# Patient Record
Sex: Female | Born: 1955 | Race: White | Hispanic: No | Marital: Married | Smoking: Former smoker
Health system: Southern US, Community
[De-identification: ages and names within clinical notes are randomized; demographics above are authoritative.]

## PROBLEM LIST (undated history)

## (undated) DIAGNOSIS — G4733 Obstructive sleep apnea (adult) (pediatric): Secondary | ICD-10-CM

## (undated) DIAGNOSIS — I10 Essential (primary) hypertension: Secondary | ICD-10-CM

## (undated) DIAGNOSIS — B029 Zoster without complications: Secondary | ICD-10-CM

## (undated) DIAGNOSIS — B019 Varicella without complication: Secondary | ICD-10-CM

## (undated) DIAGNOSIS — E785 Hyperlipidemia, unspecified: Secondary | ICD-10-CM

## (undated) DIAGNOSIS — Z01419 Encounter for gynecological examination (general) (routine) without abnormal findings: Secondary | ICD-10-CM

## (undated) DIAGNOSIS — G4730 Sleep apnea, unspecified: Secondary | ICD-10-CM

## (undated) HISTORY — DX: Encounter for gynecological examination (general) (routine) without abnormal findings: Z01.419

## (undated) HISTORY — DX: Essential (primary) hypertension: I10

## (undated) HISTORY — PX: MYOMECTOMY: SHX85

## (undated) HISTORY — DX: Obstructive sleep apnea (adult) (pediatric): G47.33

## (undated) HISTORY — DX: Zoster without complications: B02.9

## (undated) HISTORY — DX: Sleep apnea, unspecified: G47.30

## (undated) HISTORY — DX: Hyperlipidemia, unspecified: E78.5

## (undated) HISTORY — DX: Varicella without complication: B01.9

---

## 1990-11-12 HISTORY — PX: ORIF TIBIA & FIBULA FRACTURES: SHX2131

## 1994-11-12 HISTORY — PX: LEFT OOPHORECTOMY: SHX1961

## 1998-09-01 ENCOUNTER — Inpatient Hospital Stay (HOSPITAL_COMMUNITY): Admission: AD | Admit: 1998-09-01 | Discharge: 1998-09-03 | Payer: Self-pay | Admitting: *Deleted

## 1999-12-08 ENCOUNTER — Encounter: Payer: Self-pay | Admitting: Internal Medicine

## 1999-12-08 ENCOUNTER — Encounter: Admission: RE | Admit: 1999-12-08 | Discharge: 1999-12-08 | Payer: Self-pay | Admitting: Internal Medicine

## 2001-08-28 ENCOUNTER — Other Ambulatory Visit: Admission: RE | Admit: 2001-08-28 | Discharge: 2001-08-28 | Payer: Self-pay | Admitting: *Deleted

## 2001-09-02 ENCOUNTER — Encounter: Admission: RE | Admit: 2001-09-02 | Discharge: 2001-09-02 | Payer: Self-pay | Admitting: *Deleted

## 2001-09-02 ENCOUNTER — Encounter: Payer: Self-pay | Admitting: *Deleted

## 2002-02-20 ENCOUNTER — Other Ambulatory Visit: Admission: RE | Admit: 2002-02-20 | Discharge: 2002-02-20 | Payer: Self-pay | Admitting: *Deleted

## 2002-09-03 ENCOUNTER — Other Ambulatory Visit: Admission: RE | Admit: 2002-09-03 | Discharge: 2002-09-03 | Payer: Self-pay | Admitting: Obstetrics and Gynecology

## 2002-11-06 ENCOUNTER — Encounter: Payer: Self-pay | Admitting: Obstetrics and Gynecology

## 2002-11-06 ENCOUNTER — Encounter: Admission: RE | Admit: 2002-11-06 | Discharge: 2002-11-06 | Payer: Self-pay | Admitting: Obstetrics and Gynecology

## 2004-09-26 ENCOUNTER — Encounter: Admission: RE | Admit: 2004-09-26 | Discharge: 2004-09-26 | Payer: Self-pay | Admitting: Obstetrics and Gynecology

## 2006-09-16 ENCOUNTER — Encounter: Admission: RE | Admit: 2006-09-16 | Discharge: 2006-09-16 | Payer: Self-pay | Admitting: Obstetrics and Gynecology

## 2007-09-26 ENCOUNTER — Encounter (INDEPENDENT_AMBULATORY_CARE_PROVIDER_SITE_OTHER): Payer: Self-pay | Admitting: Obstetrics and Gynecology

## 2007-09-26 ENCOUNTER — Ambulatory Visit (HOSPITAL_COMMUNITY): Admission: RE | Admit: 2007-09-26 | Discharge: 2007-09-26 | Payer: Self-pay | Admitting: Obstetrics and Gynecology

## 2007-11-13 HISTORY — PX: UTERINE FIBROID SURGERY: SHX826

## 2008-07-05 ENCOUNTER — Ambulatory Visit: Payer: Self-pay | Admitting: Family Medicine

## 2008-08-09 ENCOUNTER — Ambulatory Visit: Payer: Self-pay | Admitting: Family Medicine

## 2008-08-09 LAB — CONVERTED CEMR LAB
Bilirubin Urine: NEGATIVE
Blood in Urine, dipstick: NEGATIVE
Glucose, Urine, Semiquant: NEGATIVE
Ketones, urine, test strip: NEGATIVE
Nitrite: NEGATIVE
Protein, U semiquant: NEGATIVE
Specific Gravity, Urine: 1.02
Urobilinogen, UA: 0.2
WBC Urine, dipstick: NEGATIVE
pH: 5.5

## 2008-08-10 LAB — CONVERTED CEMR LAB
ALT: 21 units/L (ref 0–35)
AST: 22 units/L (ref 0–37)
Albumin: 4.1 g/dL (ref 3.5–5.2)
Alkaline Phosphatase: 84 units/L (ref 39–117)
BUN: 15 mg/dL (ref 6–23)
Basophils Absolute: 0.1 10*3/uL (ref 0.0–0.1)
Basophils Relative: 1.2 % (ref 0.0–3.0)
Bilirubin, Direct: 0.1 mg/dL (ref 0.0–0.3)
CO2: 29 meq/L (ref 19–32)
Calcium: 9.8 mg/dL (ref 8.4–10.5)
Chloride: 108 meq/L (ref 96–112)
Cholesterol: 194 mg/dL (ref 0–200)
Creatinine, Ser: 0.7 mg/dL (ref 0.4–1.2)
Eosinophils Absolute: 0.4 10*3/uL (ref 0.0–0.7)
Eosinophils Relative: 4.9 % (ref 0.0–5.0)
GFR calc Af Amer: 113 mL/min
GFR calc non Af Amer: 94 mL/min
Glucose, Bld: 87 mg/dL (ref 70–99)
HCT: 41 % (ref 36.0–46.0)
HDL: 40.1 mg/dL (ref >39.0)
Hemoglobin: 13.9 g/dL (ref 12.0–15.0)
LDL Cholesterol: 123 mg/dL — ABNORMAL HIGH (ref 0–99)
Lymphocytes Relative: 30.2 % (ref 12.0–46.0)
MCHC: 34 g/dL (ref 30.0–36.0)
MCV: 86.3 fL (ref 78.0–100.0)
Monocytes Absolute: 0.5 10*3/uL (ref 0.1–1.0)
Monocytes Relative: 6.6 % (ref 3.0–12.0)
Neutro Abs: 4.4 10*3/uL (ref 1.4–7.7)
Neutrophils Relative %: 57.1 % (ref 43.0–77.0)
Platelets: 263 10*3/uL (ref 150–400)
Potassium: 4.1 meq/L (ref 3.5–5.1)
RBC: 4.74 M/uL (ref 3.87–5.11)
RDW: 12.4 % (ref 11.5–14.6)
Sodium: 141 meq/L (ref 135–145)
TSH: 1.92 microintl units/mL (ref 0.35–5.50)
Total Bilirubin: 0.9 mg/dL (ref 0.3–1.2)
Total CHOL/HDL Ratio: 4.8
Total Protein: 7.5 g/dL (ref 6.0–8.3)
Triglycerides: 154 mg/dL — ABNORMAL HIGH (ref 0–149)
VLDL: 31 mg/dL (ref 0–40)
WBC: 7.8 10*3/uL (ref 4.5–10.5)

## 2010-12-08 ENCOUNTER — Encounter: Payer: Self-pay | Admitting: Family Medicine

## 2010-12-20 NOTE — Letter (Signed)
Summary: [facility]  [facility]   Imported By: Maryln Gottron 12/15/2010 12:11:20  _____________________________________________________________________  External Attachment:    Type:   Image     Comment:   External Document

## 2011-03-27 NOTE — H&P (Signed)
NAMEMERYN, Aimee Swanson             ACCOUNT NO.:  [id number]   MEDICAL RECORD NO.:  [id number]          PATIENT TYPE:  AMB   LOCATION:  [facility]                           FACILITY:  [facility]   PHYSICIAN:  Lenoard Aden, M.D.DATE OF BIRTH:  30-Dec-1955   DATE OF ADMISSION:  09/26/2007  DATE OF DISCHARGE:                              HISTORY & PHYSICAL   CHIEF COMPLAINT:  Post menopausal bleeding.   She is a 55 year old white female, G1, P1 with a history of a C-section  x1, who presents with post menopausal bleeding and questionable  structural abnormality on saline sonohysterography.  She presents for a  hysteroscopy and resection.   She has allergies to:  1. PENICILLIN.  2. IBUPROFEN.  3. ASPIRIN.   FAMILY HISTORY:  Hypertension, melanoma.   PERSONAL MEDICAL HISTORY:  1. Cesarean section.  2. Laparoscopy  3. Left salpingo-oophorectomy.  4. Fractured leg with 2 screws placed.  5. Endometrial biopsy.  6. Uterine fibroid.  7. Myomectomies in 1993 and 1995.   She is a nonsmoker, nondrinker.  She denies domestic or physical  violence.   MEDICATIONS:  Multivitamin.   PHYSICAL EXAMINATION:  VITAL SIGNS:  Blood pressure is 125/70, weight  200 pounds.  HEENT:  Normal.  LUNGS:  Clear.  HEART:  Regular rhythm.  ABDOMEN:  Soft and nontender.  PELVIC:  Reveals an enlarged uterus with probable submucous fibroid as  noted by sonohysterography.  EXTREMITIES:  Reveal no cords.  NEUROLOGIC:  Nonfocal.  SKIN:  Intact.   IMPRESSION:  Post menopausal bleeding with questionable structural  lesion.   PLAN:  Proceed with diagnostic hysteroscopy D&C.  Risks of anesthesia,  infection, bleeding, injury to abdominal organs, need for repair is  discussed. Delayed versus immediate complications to include bowel and  bladder injury noted.  The patient acknowledges and wishes to proceed.      Lenoard Aden, M.D.  Electronically Signed     RJT/MEDQ  D:  09/25/2007  T:  09/26/2007  Job:   [id number]

## 2011-03-27 NOTE — Op Note (Signed)
Aimee Swanson, Aimee Swanson             ACCOUNT NO.:  [id number]   MEDICAL RECORD NO.:  [id number]          PATIENT TYPE:  AMB   LOCATION:  [facility]                           FACILITY:  [facility]   PHYSICIAN:  Lenoard Aden, M.D.DATE OF BIRTH:  [birth year]   DATE OF PROCEDURE:  09/26/2007  DATE OF DISCHARGE:                               OPERATIVE REPORT   PREOPERATIVE DIAGNOSIS:  Postmenopausal bleeding with probable  submucosal fibroid.   POSTOPERATIVE DIAGNOSIS:  Submucosal fibroid.   PROCEDURE:  Diagnostic hysteroscopy, resection of large submucous  fibroid.   SURGEON:  Olivia Mackie, M.D.   ANESTHESIA:  General.   BLOOD LOSS:  Less than 50 mL.   COMPLICATIONS:  None.   DRAINS:  None.   COUNTS:  Correct.   RECOVERY:  In good condition.   FLUID DEFICIT:  125 mL.   BRIEF OPERATIVE NOTE:  After discussing the risks of anesthesia,  infection, bleeding, injury to abdominal organs, need for repair,  possibility of uterine perforation, need for repair the patient was  taken to the operating room where she was administered a general  anesthetic without complications.  She was prepped and draped in a  sterile fashion.  Catheterized till the bladder was empty.  Exam under  anesthesia reveals a bulky mid position uterus.  Dilute Nesacaine  solution was placed for a paracervical block 20 cc total.  Dilute  Pitressin solution of 20 cc at the cervicovaginal junction at 3 and 9  o'clock was placed as well in the standard fashion.  After dilating the  cervix it was easily up to #29 [facility] dilator.  Hysteroscope was placed.  Visualization reveals a large posterior fundal appearing fibroid which  was resected in multiple passes down to the base using a double angle  right-angle loop.  After multiple  passes good hemostasis was noted.  Point coagulation was performed with  the loop.  The uterus was intact.  No evidence of perforation.  Fluid  deficit 125 mL.  D&C performed with sharp curettage.   The patient  tolerated the procedure well and is transferred to recovery in good  condition.      Lenoard Aden, M.D.  Electronically Signed     RJT/MEDQ  D:  09/26/2007  T:  09/27/2007  Job:  [id number]

## 2011-04-30 ENCOUNTER — Other Ambulatory Visit: Payer: Self-pay | Admitting: Obstetrics and Gynecology

## 2011-04-30 DIAGNOSIS — Z1231 Encounter for screening mammogram for malignant neoplasm of breast: Secondary | ICD-10-CM

## 2011-05-10 ENCOUNTER — Ambulatory Visit
Admission: RE | Admit: 2011-05-10 | Discharge: 2011-05-10 | Disposition: A | Discharge status: Home / Self Care | Payer: BC Managed Care – PPO | Source: Ambulatory Visit | Attending: Obstetrics and Gynecology | Admitting: Obstetrics and Gynecology

## 2011-05-10 DIAGNOSIS — Z1231 Encounter for screening mammogram for malignant neoplasm of breast: Secondary | ICD-10-CM

## 2011-06-01 ENCOUNTER — Other Ambulatory Visit (INDEPENDENT_AMBULATORY_CARE_PROVIDER_SITE_OTHER): Payer: BC Managed Care – PPO

## 2011-06-01 DIAGNOSIS — Z0000 Encounter for general adult medical examination without abnormal findings: Secondary | ICD-10-CM

## 2011-06-01 LAB — HEPATIC FUNCTION PANEL
Albumin: 4.4 g/dL (ref 3.5–5.2)
Alkaline Phosphatase: 96 U/L (ref 39–117)

## 2011-06-01 LAB — BASIC METABOLIC PANEL
Chloride: 106 mEq/L (ref 96–112)
Creatinine, Ser: 0.7 mg/dL (ref 0.4–1.2)
Potassium: 4 mEq/L (ref 3.5–5.1)
Sodium: 140 mEq/L (ref 135–145)

## 2011-06-01 LAB — CBC WITH DIFFERENTIAL/PLATELET
Basophils Relative: 1.8 % (ref 0.0–3.0)
Eosinophils Relative: 10.1 % — ABNORMAL HIGH (ref 0.0–5.0)
Hemoglobin: 13.8 g/dL (ref 12.0–15.0)
MCV: 84.7 fl (ref 78.0–100.0)
Monocytes Absolute: 0.5 10*3/uL (ref 0.1–1.0)
Neutro Abs: 3.3 10*3/uL (ref 1.4–7.7)
Neutrophils Relative %: 44 % (ref 43.0–77.0)
RBC: 4.81 Mil/uL (ref 3.87–5.11)
WBC: 7.4 10*3/uL (ref 4.5–10.5)

## 2011-06-01 LAB — LIPID PANEL
HDL: 40 mg/dL (ref >39.00)
LDL Cholesterol: 127 mg/dL — ABNORMAL HIGH (ref 0–99)
Total CHOL/HDL Ratio: 5
Triglycerides: 159 mg/dL — ABNORMAL HIGH (ref 0.0–149.0)

## 2011-06-01 LAB — POCT URINALYSIS DIPSTICK
Glucose, UA: NEGATIVE
Nitrite, UA: NEGATIVE
Urobilinogen, UA: 0.2

## 2011-06-01 LAB — TSH: TSH: 1.98 u[IU]/mL (ref 0.35–5.50)

## 2011-06-07 ENCOUNTER — Encounter: Payer: Self-pay | Admitting: Family Medicine

## 2011-06-07 ENCOUNTER — Telehealth: Payer: Self-pay | Admitting: Family Medicine

## 2011-06-07 NOTE — Telephone Encounter (Signed)
Message copied by Baldemar Friday on Thu Jun 07, 2011  2:20 PM ------      Message from: Gershon Crane A      Created: Tue Jun 05, 2011  8:41 AM       Normal except mildly high chol. Watch the diet

## 2011-06-07 NOTE — Telephone Encounter (Signed)
Left message with result.

## 2011-06-12 ENCOUNTER — Encounter: Payer: Self-pay | Admitting: Family Medicine

## 2011-06-12 ENCOUNTER — Ambulatory Visit (INDEPENDENT_AMBULATORY_CARE_PROVIDER_SITE_OTHER): Payer: BC Managed Care – PPO | Admitting: Family Medicine

## 2011-06-12 VITALS — BP 110/80 | HR 77 | Temp 98.1°F | Ht 65.5 in | Wt 206.0 lb

## 2011-06-12 DIAGNOSIS — Z0000 Encounter for general adult medical examination without abnormal findings: Secondary | ICD-10-CM

## 2011-06-12 DIAGNOSIS — G4730 Sleep apnea, unspecified: Secondary | ICD-10-CM

## 2011-06-12 NOTE — Progress Notes (Signed)
  Subjective:    Patient ID: Aimee Swanson, female    DOB: 1956/01/23, 55 y.o.   MRN: [id number]  HPI 55 yr old female for a cpx. She feels well but she is worried about her snoring. She has snored for years, and her husband notes that she seems to have trouble  Breathing at night. She knows she is overweight, and she is trying to do something about this. She is walking 3 miles a day and has eliminated fast food.    Review of Systems  Constitutional: Negative.  Negative for fever, diaphoresis, activity change, appetite change, fatigue and unexpected weight change.  HENT: Negative.  Negative for hearing loss, ear pain, nosebleeds, congestion, sore throat, trouble swallowing, neck pain, neck stiffness, voice change and tinnitus.   Eyes: Negative.  Negative for photophobia, pain, discharge, redness and visual disturbance.  Respiratory: Negative.  Negative for apnea, cough, choking, chest tightness, shortness of breath, wheezing and stridor.   Cardiovascular: Negative.  Negative for chest pain, palpitations and leg swelling.  Gastrointestinal: Negative.  Negative for nausea, vomiting, abdominal pain, diarrhea, constipation, blood in stool, abdominal distention and rectal pain.  Genitourinary: Negative.  Negative for dysuria, urgency, frequency, hematuria, flank pain, decreased urine volume, vaginal bleeding, vaginal discharge, enuresis, difficulty urinating, vaginal pain, menstrual problem, pelvic pain and dyspareunia.  Musculoskeletal: Negative.  Negative for myalgias, back pain, joint swelling, arthralgias and gait problem.  Skin: Negative.  Negative for color change, pallor, rash and wound.  Neurological: Negative.  Negative for dizziness, tremors, seizures, syncope, speech difficulty, weakness, light-headedness, numbness and headaches.  Hematological: Negative.  Negative for adenopathy. Does not bruise/bleed easily.  Psychiatric/Behavioral: Negative.  Negative for hallucinations, behavioral  problems, confusion, sleep disturbance, dysphoric mood and agitation. The patient is not nervous/anxious.        Objective:   Physical Exam  Constitutional: She is oriented to person, place, and time. She appears well-developed and well-nourished. No distress.  HENT:  Head: Normocephalic and atraumatic.  Right Ear: External ear normal.  Left Ear: External ear normal.  Nose: Nose normal.  Mouth/Throat: Oropharynx is clear and moist. No oropharyngeal exudate.  Eyes: Conjunctivae and EOM are normal. Pupils are equal, round, and reactive to light. No scleral icterus.  Neck: Normal range of motion. Neck supple. No JVD present. No thyromegaly present.  Cardiovascular: Normal rate, regular rhythm, normal heart sounds and intact distal pulses.  Exam reveals no gallop and no friction rub.   No murmur heard.      EKG normal   Pulmonary/Chest: Effort normal and breath sounds normal. No respiratory distress. She has no wheezes. She has no rales. She exhibits no tenderness.  Abdominal: Soft. Bowel sounds are normal. She exhibits no distension and no mass. There is no tenderness. There is no rebound and no guarding.  Musculoskeletal: Normal range of motion. She exhibits no edema and no tenderness.  Lymphadenopathy:    She has no cervical adenopathy.  Neurological: She is alert and oriented to person, place, and time. She has normal reflexes. No cranial nerve deficit. She exhibits normal muscle tone. Coordination normal.  Skin: Skin is warm and dry. No rash noted. No erythema.  Psychiatric: She has a normal mood and affect. Her behavior is normal. Judgment and thought content normal.          Assessment & Plan:   She will work on weight loss. Refer for possible sleep apnea. Set up a colonoscopy

## 2011-06-21 ENCOUNTER — Ambulatory Visit (AMBULATORY_SURGERY_CENTER): Payer: BC Managed Care – PPO | Admitting: *Deleted

## 2011-06-21 ENCOUNTER — Encounter: Payer: Self-pay | Admitting: Internal Medicine

## 2011-06-21 VITALS — Ht 65.0 in | Wt 205.0 lb

## 2011-06-21 DIAGNOSIS — Z1211 Encounter for screening for malignant neoplasm of colon: Secondary | ICD-10-CM

## 2011-06-21 MED ORDER — PEG-KCL-NACL-NASULF-NA ASC-C 100 G PO SOLR: ORAL | Status: DC

## 2011-06-28 ENCOUNTER — Ambulatory Visit (AMBULATORY_SURGERY_CENTER): Payer: BC Managed Care – PPO | Admitting: Internal Medicine

## 2011-06-28 ENCOUNTER — Encounter: Payer: Self-pay | Admitting: Internal Medicine

## 2011-06-28 VITALS — BP 136/82 | HR 74 | Temp 98.0°F | Resp 14 | Ht 65.5 in | Wt 206.0 lb

## 2011-06-28 DIAGNOSIS — D126 Benign neoplasm of colon, unspecified: Secondary | ICD-10-CM

## 2011-06-28 DIAGNOSIS — Z1211 Encounter for screening for malignant neoplasm of colon: Secondary | ICD-10-CM

## 2011-06-28 HISTORY — PX: COLONOSCOPY: SHX174

## 2011-06-28 MED ORDER — SODIUM CHLORIDE 0.9 % IV SOLN: 500.0000 mL | INTRAVENOUS | Status: DC

## 2011-06-28 NOTE — Patient Instructions (Signed)
Discharge instructions given to patient/caregiver and talked about. Handout on polyps given to patient/caregiver.

## 2011-06-29 ENCOUNTER — Telehealth: Payer: Self-pay | Admitting: *Deleted

## 2011-06-29 NOTE — Telephone Encounter (Signed)
Busy signal x2.

## 2011-07-04 ENCOUNTER — Ambulatory Visit (INDEPENDENT_AMBULATORY_CARE_PROVIDER_SITE_OTHER): Payer: BC Managed Care – PPO | Admitting: Pulmonary Disease

## 2011-07-04 ENCOUNTER — Encounter: Payer: Self-pay | Admitting: Pulmonary Disease

## 2011-07-04 ENCOUNTER — Encounter: Payer: Self-pay | Admitting: Internal Medicine

## 2011-07-04 VITALS — BP 138/82 | HR 87 | Temp 98.2°F | Ht 65.0 in | Wt 206.2 lb

## 2011-07-04 DIAGNOSIS — G4733 Obstructive sleep apnea (adult) (pediatric): Secondary | ICD-10-CM | POA: Insufficient documentation

## 2011-07-04 NOTE — Assessment & Plan Note (Signed)
The patient's history is suggestive of obstructive sleep apnea, with loud snoring and frequent awakenings, as well as inappropriate daytime sleepiness.  I have had a long discussion with the pt about sleep apnea, including its impact on QOL and CV health.  I think there is enough suspicion to schedule for a formal sleep study, and I will arrange followup once the results are available.  The patient is agreeable.

## 2011-07-04 NOTE — Progress Notes (Signed)
  Subjective:    Patient ID: Aimee Swanson, female    DOB: 1956-10-12, 55 y.o.   MRN: [id number]  HPI The patient is a 55 year old female who I've been asked to see for possible obstructive sleep apnea.  The patient has been noted to have loud snoring and rare choking arousal, but no one has commented formally on an abnormal breathing pattern during sleep.  The patient has frequent awakenings at night, and is unsure if she is rested or not in the mornings upon arising.  She works as a Runner, broadcasting/film/video, and does not have significant sleepiness during her class time.  However, she notes significant sleepiness with meetings and occasionally with paperwork at her desk.  She also falls asleep in the evening if she sits down for any more than 5 minutes.  She has occasional sleepiness with driving, but does not feel this is a significant problem.  The patient denies any leg kicking during the night, but does on rare occasions have an abnormal sensation in her legs that resolves with movement in the evening.  She states that her weight is unchanged over the last 2 years, and her Epworth score today is 15.  Sleep Questionnaire: What time do you typically go to bed?( Between what hours) 9 to 10 pm How long does it take you to fall asleep? minutes How many times during the night do you wake up? 4 What time do you get out of bed to start your day? 0600 Do you drive or operate heavy machinery in your occupation? No How much has your weight changed (up or down) over the past two years? (In pounds) 0 oz (0 kg) Have you ever had a sleep study before? No Do you currently use CPAP? No Do you wear oxygen at any time? No    Review of Systems  Constitutional: Negative for fever and unexpected weight change.  HENT: Negative for ear pain, nosebleeds, congestion, sore throat, rhinorrhea, sneezing, trouble swallowing, dental problem, postnasal drip and sinus pressure.   Eyes: Negative for redness and itching.  Respiratory: Negative  for cough, chest tightness, shortness of breath and wheezing.   Cardiovascular: Negative for palpitations and leg swelling.  Gastrointestinal: Negative for nausea and vomiting.  Genitourinary: Negative for dysuria.  Musculoskeletal: Negative for joint swelling.  Skin: Negative for rash.  Neurological: Negative for headaches.  Hematological: Does not bruise/bleed easily.  Psychiatric/Behavioral: Negative for dysphoric mood. The patient is not nervous/anxious.        Objective:   Physical Exam Constitutional:  Overweight female, no acute distress  HENT:  Nares patent without discharge, but large turbinates noted.  Oropharynx without exudate, palate and uvula are moderately elongated.  Eyes:  Perrla, eomi, no scleral icterus  Neck:  No JVD, no TMG  Cardiovascular:  Normal rate, regular rhythm, no rubs or gallops.  No murmurs        Intact distal pulses  Pulmonary :  Normal breath sounds, no stridor or respiratory distress   No rales, rhonchi, or wheezing  Abdominal:  Soft, nondistended, bowel sounds present.  No tenderness noted.   Musculoskeletal:  No lower extremity edema noted.  Lymph Nodes:  No cervical lymphadenopathy noted  Skin:  No cyanosis noted  Neurologic:  Alert, appropriate, moves all 4 extremities without obvious deficit.         Assessment & Plan:

## 2011-07-04 NOTE — Patient Instructions (Signed)
Will schedule for sleep study, and arrange followup once results available.  

## 2011-08-10 ENCOUNTER — Ambulatory Visit (HOSPITAL_BASED_OUTPATIENT_CLINIC_OR_DEPARTMENT_OTHER): Payer: BC Managed Care – PPO | Attending: Pulmonary Disease

## 2011-08-10 DIAGNOSIS — R259 Unspecified abnormal involuntary movements: Secondary | ICD-10-CM | POA: Insufficient documentation

## 2011-08-10 DIAGNOSIS — G4733 Obstructive sleep apnea (adult) (pediatric): Secondary | ICD-10-CM | POA: Insufficient documentation

## 2011-08-21 LAB — HCG, SERUM, QUALITATIVE: Preg, Serum: NEGATIVE

## 2011-08-21 LAB — CBC
MCHC: 34.7
RBC: 4.64
RDW: 12.6

## 2011-08-21 LAB — SAMPLE TO BLOOD BANK

## 2011-08-27 ENCOUNTER — Telehealth: Payer: Self-pay | Admitting: *Deleted

## 2011-08-27 DIAGNOSIS — G4733 Obstructive sleep apnea (adult) (pediatric): Secondary | ICD-10-CM

## 2011-08-27 DIAGNOSIS — G4761 Periodic limb movement disorder: Secondary | ICD-10-CM

## 2011-08-27 NOTE — Telephone Encounter (Signed)
Pt due for f/u appt with KC to discuss sleep study results.  LM with family member for pt to call back.

## 2011-08-27 NOTE — Procedures (Signed)
Aimee, Swanson             ACCOUNT NO.:  [id number]  MEDICAL RECORD NO.:  [id number]          PATIENT TYPE:  OUT  LOCATION:  SLEEP CENTER                 FACILITY:  [facility]  PHYSICIAN:  Barbaraann Share, MD,FCCPDATE OF BIRTH:  05[birth year]03[birth year]57  DATE OF STUDY:  08/10/2011                           NOCTURNAL POLYSOMNOGRAM  REFERRING PHYSICIAN:  Barbaraann Share, MD,FCCP  INDICATION FOR STUDY:  Hypersomnia with sleep apnea.  EPWORTH SCORE:  17.  SLEEP ARCHITECTURE:  The patient had a total sleep time of 364 minutes with very little slow-wave sleep and only 90 minutes of REM.  Sleep onset latency was normal at 6 minutes, and REM onset was normal at 77 minutes.  Sleep efficiency was adequate at 94%.  RESPIRATORY DATA:  The patient was found to have 46 apneas and 23 obstructive hypopneas, giving her an apnea-hypopnea index of 11 events per hour.  The events occurred in all body positions but were more prominent during REM.  Loud snoring was noted throughout.  OXYGEN DATA:  There was O2 desaturation as low as 85% with the patient's obstructive events.  CARDIAC DATA:  No clinically significant arrhythmias were noted.  MOVEMENT/PARASOMNIA:  The patient had 112 periodic limb movements with 1.2 per hour, resulting in arousal or awakenings.  There were no abnormal behavior seen.  IMPRESSION/RECOMMENDATION: 1. Mild obstructive sleep apnea/hypopnea syndrome with an     apnea/hypopnea index of 11 events per hour and O2 desaturation as     low as 85%.  The events were much more frequent during REM.     Treatment for this degree of sleep apnea can include a trial of     weight loss alone, upper airway surgery, dental appliance, and also     CPAP.  Clinical correlation is suggested 2. Large numbers of periodic limb movements with only mild sleep     disruption.    Barbaraann Share, MD,FCCP Diplomate, American Board of Sleep Medicine Electronically Signed   [facility]/MEDQ  D:  08/27/2011  08:24:00  T:  08/27/2011 22:10:05  Job:  [id number]

## 2011-08-29 NOTE — Telephone Encounter (Signed)
Pt returning [name]'s call can be reached after 4 at [phone].Aimee Swanson

## 2011-08-29 NOTE — Telephone Encounter (Signed)
Called and spoke with pt.  Pt scheduled to see [facility] 09/12/11 at 4:30 pm

## 2011-09-12 ENCOUNTER — Encounter: Payer: Self-pay | Admitting: Pulmonary Disease

## 2011-09-12 ENCOUNTER — Ambulatory Visit (INDEPENDENT_AMBULATORY_CARE_PROVIDER_SITE_OTHER): Payer: BC Managed Care – PPO | Admitting: Pulmonary Disease

## 2011-09-12 VITALS — BP 130/84 | HR 97 | Temp 98.2°F | Ht 65.0 in | Wt 209.6 lb

## 2011-09-12 DIAGNOSIS — G4733 Obstructive sleep apnea (adult) (pediatric): Secondary | ICD-10-CM

## 2011-09-12 NOTE — Patient Instructions (Signed)
Think about cpap versus dental appliance while trying to work on weight loss.  Review the provided material, and check with insurance on coverage of cpap.  Please call me regarding your decision, and we can set up treatment.

## 2011-09-12 NOTE — Assessment & Plan Note (Signed)
The patient has mild obstructive sleep apnea by her recent study, and therefore does not represent a significant cardiac risk for her.  I have outlined a conservative and aggressive approach, with a trial of weight loss over the next 4-6 months being the most conservative.  I have also discussed with her the possibility of upper airway surgery, dental appliance, and CPAP.  The latter 2 being her best options.  The patient would like to do some research on her own, and I have given her educational materials on a dental appliance.  She will let me know sometime this week of her decision.  Either way, she understands that she needs to work aggressively on weight loss.

## 2011-09-12 NOTE — Progress Notes (Signed)
  Subjective:    Patient ID: Aimee Swanson, female    DOB: 01-29-1956, 55 y.o.   MRN: [id number]  HPI The patient comes in today for followup of her recent sleep study.  She was found to have mild obstructive sleep apnea with an AHI of 11 events per hour.  She was also noted to have large numbers of leg jerks with minimal sleep disruption, and denies any symptoms consistent with a primary movement disorder of sleep.  I have reviewed the study in detail with her, and answered all of her questions.   Review of Systems  Constitutional: Negative for fever and unexpected weight change.  HENT: Negative for ear pain, nosebleeds, congestion, sore throat, rhinorrhea, sneezing, trouble swallowing, dental problem, postnasal drip and sinus pressure.   Eyes: Negative for redness and itching.  Respiratory: Negative for cough, chest tightness, shortness of breath and wheezing.   Cardiovascular: Negative for palpitations and leg swelling.  Gastrointestinal: Negative for nausea and vomiting.  Genitourinary: Negative for dysuria.  Musculoskeletal: Negative for joint swelling.  Skin: Negative for rash.  Neurological: Negative for headaches.  Hematological: Does not bruise/bleed easily.  Psychiatric/Behavioral: Negative for dysphoric mood. The patient is not nervous/anxious.        Objective:   Physical Exam Overweight female in no acute distress Nose without discharge or purulence noted Lower extremities without edema, no cyanosis Alert, does not appear overly sleepy, moves all 4 extremities.       Assessment & Plan:

## 2012-05-01 ENCOUNTER — Other Ambulatory Visit: Payer: Self-pay | Admitting: Obstetrics and Gynecology

## 2012-05-01 DIAGNOSIS — Z1231 Encounter for screening mammogram for malignant neoplasm of breast: Secondary | ICD-10-CM

## 2012-05-22 ENCOUNTER — Ambulatory Visit
Admission: RE | Admit: 2012-05-22 | Discharge: 2012-05-22 | Disposition: A | Discharge status: Home / Self Care | Payer: BC Managed Care – PPO | Source: Ambulatory Visit | Attending: Obstetrics and Gynecology | Admitting: Obstetrics and Gynecology

## 2012-05-22 DIAGNOSIS — Z1231 Encounter for screening mammogram for malignant neoplasm of breast: Secondary | ICD-10-CM

## 2012-06-25 ENCOUNTER — Other Ambulatory Visit (INDEPENDENT_AMBULATORY_CARE_PROVIDER_SITE_OTHER): Payer: BC Managed Care – PPO

## 2012-06-25 DIAGNOSIS — Z0000 Encounter for general adult medical examination without abnormal findings: Secondary | ICD-10-CM

## 2012-06-25 LAB — CBC WITH DIFFERENTIAL/PLATELET
Basophils Relative: 0.7 % (ref 0.0–3.0)
Eosinophils Absolute: 0.3 10*3/uL (ref 0.0–0.7)
HCT: 40.3 % (ref 36.0–46.0)
Hemoglobin: 13.4 g/dL (ref 12.0–15.0)
Lymphocytes Relative: 37.1 % (ref 12.0–46.0)
Lymphs Abs: 2.6 10*3/uL (ref 0.7–4.0)
MCHC: 33.1 g/dL (ref 30.0–36.0)
MCV: 85.5 fl (ref 78.0–100.0)
Monocytes Absolute: 0.5 10*3/uL (ref 0.1–1.0)
Neutro Abs: 3.5 10*3/uL (ref 1.4–7.7)
RBC: 4.71 Mil/uL (ref 3.87–5.11)

## 2012-06-25 LAB — POCT URINALYSIS DIPSTICK
Ketones, UA: NEGATIVE
Protein, UA: NEGATIVE
Spec Grav, UA: >=1.03

## 2012-06-25 LAB — HEPATIC FUNCTION PANEL
Albumin: 4.2 g/dL (ref 3.5–5.2)
Alkaline Phosphatase: 78 U/L (ref 39–117)
Total Protein: 7.3 g/dL (ref 6.0–8.3)

## 2012-06-25 LAB — LIPID PANEL
Cholesterol: 226 mg/dL — ABNORMAL HIGH (ref 0–200)
HDL: 48.7 mg/dL (ref >39.00)

## 2012-06-25 LAB — TSH: TSH: 1.62 u[IU]/mL (ref 0.35–5.50)

## 2012-06-25 LAB — BASIC METABOLIC PANEL
CO2: 26 mEq/L (ref 19–32)
Chloride: 103 mEq/L (ref 96–112)
Glucose, Bld: 90 mg/dL (ref 70–99)
Potassium: 4 mEq/L (ref 3.5–5.1)
Sodium: 138 mEq/L (ref 135–145)

## 2012-06-25 LAB — LDL CHOLESTEROL, DIRECT: Direct LDL: 160 mg/dL

## 2012-06-30 ENCOUNTER — Ambulatory Visit (INDEPENDENT_AMBULATORY_CARE_PROVIDER_SITE_OTHER): Payer: BC Managed Care – PPO | Admitting: Family Medicine

## 2012-06-30 ENCOUNTER — Encounter: Payer: Self-pay | Admitting: Family Medicine

## 2012-06-30 VITALS — BP 124/80 | HR 99 | Temp 99.3°F | Ht 65.0 in | Wt 207.0 lb

## 2012-06-30 DIAGNOSIS — Z0000 Encounter for general adult medical examination without abnormal findings: Secondary | ICD-10-CM

## 2012-06-30 NOTE — Progress Notes (Signed)
  Subjective:    Patient ID: Aimee Swanson, female    DOB: 04-17-1956, 56 y.o.   MRN: [id number]  HPI 56 yr old female for a cpx. She feels fine and has no concerns. She sees Dr. Billy Coast for GYN exams. She admits to a poor diet over the past year. Her LDL has jumped up to 160.    Review of Systems  Constitutional: Negative.   HENT: Negative.   Eyes: Negative.   Respiratory: Negative.   Cardiovascular: Negative.   Gastrointestinal: Negative.   Genitourinary: Negative for dysuria, urgency, frequency, hematuria, flank pain, decreased urine volume, enuresis, difficulty urinating, pelvic pain and dyspareunia.  Musculoskeletal: Negative.   Skin: Negative.   Neurological: Negative.   Hematological: Negative.   Psychiatric/Behavioral: Negative.        Objective:   Physical Exam  Constitutional: She is oriented to person, place, and time. She appears well-developed and well-nourished. No distress.  HENT:  Head: Normocephalic and atraumatic.  Right Ear: External ear normal.  Left Ear: External ear normal.  Nose: Nose normal.  Mouth/Throat: Oropharynx is clear and moist. No oropharyngeal exudate.  Eyes: Conjunctivae and EOM are normal. Pupils are equal, round, and reactive to light. No scleral icterus.  Neck: Normal range of motion. Neck supple. No JVD present. No thyromegaly present.  Cardiovascular: Normal rate, regular rhythm, normal heart sounds and intact distal pulses.  Exam reveals no gallop and no friction rub.   No murmur heard.      EKG normal  Pulmonary/Chest: Effort normal and breath sounds normal. No respiratory distress. She has no wheezes. She has no rales. She exhibits no tenderness.  Abdominal: Soft. Bowel sounds are normal. She exhibits no distension and no mass. There is no tenderness. There is no rebound and no guarding.  Musculoskeletal: Normal range of motion. She exhibits no edema and no tenderness.  Lymphadenopathy:    She has no cervical adenopathy.    Neurological: She is alert and oriented to person, place, and time. She has normal reflexes. No cranial nerve deficit. She exhibits normal muscle tone. Coordination normal.  Skin: Skin is warm and dry. No rash noted. No erythema.  Psychiatric: She has a normal mood and affect. Her behavior is normal. Judgment and thought content normal.          Assessment & Plan:  Well exam. We discussed her diet and the need for her  to lose some weight.

## 2014-05-17 ENCOUNTER — Other Ambulatory Visit: Payer: Self-pay

## 2014-05-17 DIAGNOSIS — Z1231 Encounter for screening mammogram for malignant neoplasm of breast: Secondary | ICD-10-CM

## 2014-06-08 ENCOUNTER — Ambulatory Visit
Admission: RE | Admit: 2014-06-08 | Discharge: 2014-06-08 | Disposition: A | Discharge status: Home / Self Care | Payer: BC Managed Care – PPO | Source: Ambulatory Visit

## 2014-06-08 ENCOUNTER — Encounter (INDEPENDENT_AMBULATORY_CARE_PROVIDER_SITE_OTHER): Payer: Self-pay

## 2014-06-08 DIAGNOSIS — Z1231 Encounter for screening mammogram for malignant neoplasm of breast: Secondary | ICD-10-CM

## 2014-06-17 ENCOUNTER — Other Ambulatory Visit (INDEPENDENT_AMBULATORY_CARE_PROVIDER_SITE_OTHER): Payer: BC Managed Care – PPO

## 2014-06-17 DIAGNOSIS — Z0000 Encounter for general adult medical examination without abnormal findings: Secondary | ICD-10-CM

## 2014-06-17 LAB — CBC WITH DIFFERENTIAL/PLATELET
BASOS ABS: 0 10*3/uL (ref 0.0–0.1)
Basophils Relative: 0.4 % (ref 0.0–3.0)
EOS ABS: 0.3 10*3/uL (ref 0.0–0.7)
EOS PCT: 3.4 % (ref 0.0–5.0)
HEMATOCRIT: 41.3 % (ref 36.0–46.0)
Hemoglobin: 13.8 g/dL (ref 12.0–15.0)
LYMPHS ABS: 3.1 10*3/uL (ref 0.7–4.0)
Lymphocytes Relative: 38.6 % (ref 12.0–46.0)
MCHC: 33.5 g/dL (ref 30.0–36.0)
MCV: 84.3 fl (ref 78.0–100.0)
MONO ABS: 0.6 10*3/uL (ref 0.1–1.0)
Monocytes Relative: 7.6 % (ref 3.0–12.0)
NEUTROS PCT: 50 % (ref 43.0–77.0)
Neutro Abs: 4 10*3/uL (ref 1.4–7.7)
PLATELETS: 295 10*3/uL (ref 150.0–400.0)
RBC: 4.9 Mil/uL (ref 3.87–5.11)
RDW: 13.8 % (ref 11.5–15.5)
WBC: 8 10*3/uL (ref 4.0–10.5)

## 2014-06-17 LAB — BASIC METABOLIC PANEL
BUN: 21 mg/dL (ref 6–23)
CALCIUM: 9.4 mg/dL (ref 8.4–10.5)
CO2: 26 mEq/L (ref 19–32)
Chloride: 105 mEq/L (ref 96–112)
Creatinine, Ser: 0.8 mg/dL (ref 0.4–1.2)
GFR: 77.5 mL/min (ref >60.00)
GLUCOSE: 91 mg/dL (ref 70–99)
POTASSIUM: 4.1 meq/L (ref 3.5–5.1)
SODIUM: 139 meq/L (ref 135–145)

## 2014-06-17 LAB — HEPATIC FUNCTION PANEL
ALK PHOS: 80 U/L (ref 39–117)
ALT: 20 U/L (ref 0–35)
AST: 16 U/L (ref 0–37)
Albumin: 4.2 g/dL (ref 3.5–5.2)
BILIRUBIN DIRECT: 0 mg/dL (ref 0.0–0.3)
BILIRUBIN TOTAL: 0.5 mg/dL (ref 0.2–1.2)
Total Protein: 7.4 g/dL (ref 6.0–8.3)

## 2014-06-17 LAB — LIPID PANEL
CHOLESTEROL: 220 mg/dL — AB (ref 0–200)
HDL: 41.6 mg/dL (ref >39.00)
LDL Cholesterol: 147 mg/dL — ABNORMAL HIGH (ref 0–99)
NONHDL: 178.4
TRIGLYCERIDES: 158 mg/dL — AB (ref 0.0–149.0)
Total CHOL/HDL Ratio: 5
VLDL: 31.6 mg/dL (ref 0.0–40.0)

## 2014-06-17 LAB — POCT URINALYSIS DIPSTICK
BILIRUBIN UA: NEGATIVE
GLUCOSE UA: NEGATIVE
KETONES UA: NEGATIVE
NITRITE UA: NEGATIVE
PH UA: 5
Protein, UA: NEGATIVE
RBC UA: NEGATIVE
Spec Grav, UA: 1.02
Urobilinogen, UA: 0.2

## 2014-06-17 LAB — TSH: TSH: 0.92 u[IU]/mL (ref 0.35–4.50)

## 2014-06-18 MED ORDER — CIPROFLOXACIN HCL 500 MG PO TABS: 500.0000 mg | ORAL_TABLET | Freq: Two times a day (BID) | ORAL | Status: DC

## 2014-06-18 NOTE — Addendum Note (Signed)
Addended by: Aggie Hacker A on: 06/18/2014 03:33 PM   Modules accepted: Orders

## 2014-06-28 ENCOUNTER — Ambulatory Visit (INDEPENDENT_AMBULATORY_CARE_PROVIDER_SITE_OTHER): Payer: BC Managed Care – PPO | Admitting: Family Medicine

## 2014-06-28 ENCOUNTER — Encounter: Payer: Self-pay | Admitting: Family Medicine

## 2014-06-28 VITALS — BP 134/100 | HR 89 | Temp 98.6°F | Ht 65.0 in | Wt 206.0 lb

## 2014-06-28 DIAGNOSIS — Z0000 Encounter for general adult medical examination without abnormal findings: Secondary | ICD-10-CM

## 2014-06-28 NOTE — Progress Notes (Signed)
Pre visit review using our clinic review tool, if applicable. No additional management support is needed unless otherwise documented below in the visit note. 

## 2014-06-28 NOTE — Progress Notes (Signed)
   Subjective:    Patient ID: Aimee Swanson, female    DOB: Jun 17, 1956, 58 y.o.   MRN: [id number]  HPI 58 yr old female for a cpx. She feels fine except she injured her left knee 2 weeks ago when she twisted it by walking on uneven brick pavement. She had some mild swelling at first but this has gone down. She had a fair amount of pain at first but this has improved also. It is still mildly painful at times. She wears a support sleeve sometimes.    Review of Systems  Constitutional: Negative.   HENT: Negative.   Eyes: Negative.   Respiratory: Negative.   Cardiovascular: Negative.   Gastrointestinal: Negative.   Genitourinary: Negative for dysuria, urgency, frequency, hematuria, flank pain, decreased urine volume, enuresis, difficulty urinating, pelvic pain and dyspareunia.  Musculoskeletal: Positive for arthralgias. Negative for back pain, gait problem, joint swelling, myalgias, neck pain and neck stiffness.  Skin: Negative.   Neurological: Negative.   Psychiatric/Behavioral: Negative.        Objective:   Physical Exam  Constitutional: She is oriented to person, place, and time. She appears well-developed and well-nourished. No distress.  HENT:  Head: Normocephalic and atraumatic.  Right Ear: External ear normal.  Left Ear: External ear normal.  Nose: Nose normal.  Mouth/Throat: Oropharynx is clear and moist. No oropharyngeal exudate.  Eyes: Conjunctivae and EOM are normal. Pupils are equal, round, and reactive to light. No scleral icterus.  Neck: Normal range of motion. Neck supple. No JVD present. No thyromegaly present.  Cardiovascular: Normal rate, regular rhythm, normal heart sounds and intact distal pulses.  Exam reveals no gallop and no friction rub.   No murmur heard. Pulmonary/Chest: Effort normal and breath sounds normal. No respiratory distress. She has no wheezes. She has no rales. She exhibits no tenderness.  Abdominal: Soft. Bowel sounds are normal. She exhibits  no distension and no mass. There is no tenderness. There is no rebound and no guarding.  Musculoskeletal: Normal range of motion. She exhibits no edema.  The left knee is mildly tender along the medial joint space. She has full ROM with some pain on extreme extension. Anterior drawer is negative, McMurrays is negative.   Lymphadenopathy:    She has no cervical adenopathy.  Neurological: She is alert and oriented to person, place, and time. She has normal reflexes. No cranial nerve deficit. She exhibits normal muscle tone. Coordination normal.  Skin: Skin is warm and dry. No rash noted. No erythema.  Psychiatric: She has a normal mood and affect. Her behavior is normal. Judgment and thought content normal.          Assessment & Plan:  Well exam. She has a knee sprain which seems to be improving. She will return in 2 weeks if it is still bothering her at that point.

## 2016-02-17 ENCOUNTER — Other Ambulatory Visit: Payer: Self-pay

## 2016-02-17 DIAGNOSIS — Z1231 Encounter for screening mammogram for malignant neoplasm of breast: Secondary | ICD-10-CM

## 2016-03-12 ENCOUNTER — Ambulatory Visit
Admission: RE | Admit: 2016-03-12 | Discharge: 2016-03-12 | Disposition: A | Discharge status: Home / Self Care | Payer: BC Managed Care – PPO | Source: Ambulatory Visit

## 2016-03-12 DIAGNOSIS — Z1231 Encounter for screening mammogram for malignant neoplasm of breast: Secondary | ICD-10-CM

## 2017-03-07 ENCOUNTER — Other Ambulatory Visit: Payer: Self-pay | Admitting: Obstetrics and Gynecology

## 2017-03-07 DIAGNOSIS — Z1231 Encounter for screening mammogram for malignant neoplasm of breast: Secondary | ICD-10-CM

## 2017-04-02 ENCOUNTER — Ambulatory Visit
Admission: RE | Admit: 2017-04-02 | Discharge: 2017-04-02 | Disposition: A | Discharge status: Home / Self Care | Payer: BC Managed Care – PPO | Source: Ambulatory Visit | Attending: Obstetrics and Gynecology | Admitting: Obstetrics and Gynecology

## 2017-04-02 DIAGNOSIS — Z1231 Encounter for screening mammogram for malignant neoplasm of breast: Secondary | ICD-10-CM

## 2017-08-01 ENCOUNTER — Encounter: Payer: Self-pay | Admitting: Family Medicine

## 2018-03-10 ENCOUNTER — Other Ambulatory Visit: Payer: Self-pay | Admitting: Obstetrics and Gynecology

## 2018-03-10 DIAGNOSIS — Z1231 Encounter for screening mammogram for malignant neoplasm of breast: Secondary | ICD-10-CM

## 2018-04-08 ENCOUNTER — Ambulatory Visit: Payer: BC Managed Care – PPO

## 2018-04-23 ENCOUNTER — Ambulatory Visit: Payer: BC Managed Care – PPO

## 2018-05-01 ENCOUNTER — Ambulatory Visit: Payer: BC Managed Care – PPO | Admitting: Family Medicine

## 2018-05-01 ENCOUNTER — Encounter: Payer: Self-pay | Admitting: Family Medicine

## 2018-05-01 VITALS — BP 139/81 | HR 77 | Temp 98.0°F | Resp 20 | Ht 65.0 in | Wt 212.0 lb

## 2018-05-01 DIAGNOSIS — E7800 Pure hypercholesterolemia, unspecified: Secondary | ICD-10-CM | POA: Diagnosis not present

## 2018-05-01 DIAGNOSIS — Z131 Encounter for screening for diabetes mellitus: Secondary | ICD-10-CM | POA: Diagnosis not present

## 2018-05-01 DIAGNOSIS — Z23 Encounter for immunization: Secondary | ICD-10-CM

## 2018-05-01 DIAGNOSIS — Z130 Encounter for screening for diseases of the blood and blood-forming organs and certain disorders involving the immune mechanism: Secondary | ICD-10-CM | POA: Diagnosis not present

## 2018-05-01 DIAGNOSIS — Z114 Encounter for screening for human immunodeficiency virus [HIV]: Secondary | ICD-10-CM

## 2018-05-01 DIAGNOSIS — Z7689 Persons encountering health services in other specified circumstances: Secondary | ICD-10-CM

## 2018-05-01 DIAGNOSIS — E669 Obesity, unspecified: Secondary | ICD-10-CM | POA: Insufficient documentation

## 2018-05-01 DIAGNOSIS — Z1159 Encounter for screening for other viral diseases: Secondary | ICD-10-CM

## 2018-05-01 DIAGNOSIS — Z0000 Encounter for general adult medical examination without abnormal findings: Secondary | ICD-10-CM

## 2018-05-01 NOTE — Patient Instructions (Signed)
Health Maintenance, Female Adopting a healthy lifestyle and getting preventive care can go a long way to promote health and wellness. Talk with your health care provider about what schedule of regular examinations is right for you. This is a good chance for you to check in with your provider about disease prevention and staying healthy. In between checkups, there are plenty of things you can do on your own. Experts have done a lot of research about which lifestyle changes and preventive measures are most likely to keep you healthy. Ask your health care provider for more information. Weight and diet Eat a healthy diet  Be sure to include plenty of vegetables, fruits, low-fat dairy products, and lean protein.  Do not eat a lot of foods high in solid fats, added sugars, or salt. Get regular exercise. This is one of the most important things you can do for your health. Anaphylactic Reaction An anaphylactic reaction (anaphylaxis) is a sudden allergic reaction that is very bad (severe). It also affects more than one part of the body. This condition can be life-threatening. If you have an anaphylactic reaction, you need to get medical help right away. You may need to stay in the hospital. Your doctor may teach you how to use an allergy kit (anaphylaxis kit) and how to give yourself an allergy shot (epinephrine injection). You can give yourself an allergy shot with what is commonly called an auto-injector "pen." Symptoms of an anaphylactic reaction may include:  A stuffy nose (nasal congestion).  Headache.  Tingling in your mouth.  A flushed face.  An itchy, red rash.  Swelling of your eyes, lips, face, or tongue.  Swelling of the back of your mouth and your throat.  Breathing loudly (wheezing).  A hoarse voice.  Itchy, red, swollen areas of skin (hives).  Dizziness or light-headedness.  Passing out (fainting).  Feeling worried or nervous (anxiety).  Feeling confused.  Pain in your  belly (abdomen) or chest.  Trouble with breathing, talking, or swallowing.  A tight feeling in your chest or throat.  Fast or uneven heartbeats (palpitations).  Throwing up (vomiting).  Watery poop (diarrhea).  Follow these instructions at home: Safety  Always keep an auto-injector pen or your allergy kit with you. These could save your life. Use them as told by your doctor.  Do not drive until your doctor says that it is safe.  Make sure that you, the people who live with you, and your employer know: ? How to use your allergy kit. ? How to use an auto-injector pen to give you an allergy shot.  If you used your auto-injector pen: ? Get more medicine for it right away. This is important in case you have another reaction. ? Get help right away.  Wear a bracelet or necklace that says you have an allergy, if your doctor tells you to do this.  Learn the signs of a very bad allergic reaction.  Work with your doctors to make a plan for what to do if you have a very bad allergic reaction. Being prepared is important. General instructions  Take over-the-counter and prescription medicines only as told by your doctor.  If you have itchy, red, swollen areas of skin or a rash: ? Use over-the-counter medicine (antihistamine) as told by your doctor. ? Put cold, wet cloths (cold compresses) on your skin. ? Take baths or showers in cool water. Avoid hot water.  If you had tests done, it is up to you to get your  test results. Ask your doctor when your results will be ready.  Tell any doctors who care for you that you have an allergy.  Keep all follow-up visits as told by your doctor. This is important. How is this prevented?  Avoid things (allergens) that gave you a very bad allergic reaction before.  If you have a food allergy and you go to a restaurant, tell your server about your allergy. If you are not sure if your meal was made with food that you are allergic to, ask your server  before you eat it. Contact a doctor if:  You have symptoms of an allergic reaction. You may notice them soon after being around whatever it is that you are allergic to. Symptoms may include: ? A rash. ? A headache. ? Sneezing or a runny nose. ? Swelling. ? Feeling sick to your stomach. ? Watery poop. Get help right away if:  You had to use your auto-injector pen. You must go to the emergency room even if the medicine seems to be working.  You have any of these: ? A tight feeling in your chest or your throat. ? Loud breathing. ? Trouble with breathing. ? Itchy, red, swollen areas of skin. ? Red skin or itching all over your body. ? Swelling in your lips, tongue, or the back of your throat.  You have throwing up that gets very bad.  You have watery poop that gets very bad.  You pass out or feel like you might pass out. These symptoms may be an emergency. Do not wait to see if the symptoms will go away. Use your auto-injector pen or allergy kit as you have been told. Get medical help right away. Call your local emergency services (911 in the [place] Do not drive yourself to the hospital. Summary  An anaphylactic reaction (anaphylaxis) is a sudden allergic reaction that is very bad (severe).  This condition can be life-threatening. If you have an anaphylactic reaction, you need to get medical help right away.  Your doctor may teach you how to use an allergy kit (anaphylaxis kit) and how to give yourself an allergy shot (epinephrine injection) with an auto-injector "pen."  Always keep an auto-injector pen or your allergy kit with you. These could save your life. Use them as told by your doctor.  If you had to use your auto-injector pen, you must go to the emergency room even if the medicine seems to be working. This information is not intended to replace advice given to you by your health care provider. Make sure you discuss any questions you have with your health care  provider. Document Released: 04/16/2008 Document Revised: 06/22/2016 Document Reviewed: 06/22/2016 Elsevier Interactive Patient Education  2017 [employer].   ? Most adults should exercise for at least 150 minutes each week. The exercise should increase your heart rate and make you sweat (moderate-intensity exercise). ? Most adults should also do strengthening exercises at least twice a week. This is in addition to the moderate-intensity exercise.  Maintain a healthy weight  Body mass index (BMI) is a measurement that can be used to identify possible weight problems. It estimates body fat based on height and weight. Your health care provider can help determine your BMI and help you achieve or maintain a healthy weight.  For females 35 years of age and older: ? A BMI below 18.5 is considered underweight. ? A BMI of 18.5 to 24.9 is normal. ? A BMI of 25 to 29.9 is  considered overweight. ? A BMI of 30 and above is considered obese.  Watch levels of cholesterol and blood lipids  You should start having your blood tested for lipids and cholesterol at 61 years of age, then have this test every 5 years.  You may need to have your cholesterol levels checked more often if: ? Your lipid or cholesterol levels are high. ? You are older than 61 years of age. ? You are at high risk for heart disease.  Cancer screening Lung Cancer  Lung cancer screening is recommended for adults 98-71 years old who are at high risk for lung cancer because of a history of smoking.  A yearly low-dose CT scan of the lungs is recommended for people who: ? Currently smoke. ? Have quit within the past 15 years. ? Have at least a 30-pack-year history of smoking. A pack year is smoking an average of one pack of cigarettes a day for 1 year.  Yearly screening should continue until it has been 15 years since you quit.  Yearly screening should stop if you develop a health problem that would prevent you from having lung  cancer treatment.  Breast Cancer  Practice breast self-awareness. This means understanding how your breasts normally appear and feel.  It also means doing regular breast self-exams. Let your health care provider know about any changes, no matter how small.  If you are in your 20s or 30s, you should have a clinical breast exam (CBE) by a health care provider every 1-3 years as part of a regular health exam.  If you are 50 or older, have a CBE every year. Also consider having a breast X-ray (mammogram) every year.  If you have a family history of breast cancer, talk to your health care provider about genetic screening.  If you are at high risk for breast cancer, talk to your health care provider about having an MRI and a mammogram every year.  Breast cancer gene (BRCA) assessment is recommended for women who have family members with BRCA-related cancers. BRCA-related cancers include: ? Breast. ? Ovarian. ? Tubal. ? Peritoneal cancers.  Results of the assessment will determine the need for genetic counseling and BRCA1 and BRCA2 testing.  Cervical Cancer Your health care provider may recommend that you be screened regularly for cancer of the pelvic organs (ovaries, uterus, and vagina). This screening involves a pelvic examination, including checking for microscopic changes to the surface of your cervix (Pap test). You may be encouraged to have this screening done every 3 years, beginning at age 41.  For women ages 46-65, health care providers may recommend pelvic exams and Pap testing every 3 years, or they may recommend the Pap and pelvic exam, combined with testing for human papilloma virus (HPV), every 5 years. Some types of HPV increase your risk of cervical cancer. Testing for HPV may also be done on women of any age with unclear Pap test results.  Other health care providers may not recommend any screening for nonpregnant women who are considered low risk for pelvic cancer and who do  not have symptoms. Ask your health care provider if a screening pelvic exam is right for you.  If you have had past treatment for cervical cancer or a condition that could lead to cancer, you need Pap tests and screening for cancer for at least 20 years after your treatment. If Pap tests have been discontinued, your risk factors (such as having a new sexual partner) need to be reassessed  to determine if screening should resume. Some women have medical problems that increase the chance of getting cervical cancer. In these cases, your health care provider may recommend more frequent screening and Pap tests.  Colorectal Cancer  This type of cancer can be detected and often prevented.  Routine colorectal cancer screening usually begins at 61 years of age and continues through 61 years of age.  Your health care provider may recommend screening at an earlier age if you have risk factors for colon cancer.  Your health care provider may also recommend using home test kits to check for hidden blood in the stool.  A small camera at the end of a tube can be used to examine your colon directly (sigmoidoscopy or colonoscopy). This is done to check for the earliest forms of colorectal cancer.  Routine screening usually begins at age 73.  Direct examination of the colon should be repeated every 5-10 years through 61 years of age. However, you may need to be screened more often if early forms of precancerous polyps or small growths are found.  Skin Cancer  Check your skin from head to toe regularly.  Tell your health care provider about any new moles or changes in moles, especially if there is a change in a mole's shape or color.  Also tell your health care provider if you have a mole that is larger than the size of a pencil eraser.  Always use sunscreen. Apply sunscreen liberally and repeatedly throughout the day.  Protect yourself by wearing long sleeves, pants, a wide-brimmed hat, and sunglasses  whenever you are outside.  Heart disease, diabetes, and high blood pressure  High blood pressure causes heart disease and increases the risk of stroke. High blood pressure is more likely to develop in: ? People who have blood pressure in the high end of the normal range (130-139/85-89 mm Hg). ? People who are overweight or obese. ? People who are African American.  If you are 21-30 years of age, have your blood pressure checked every 3-5 years. If you are 69 years of age or older, have your blood pressure checked every year. You should have your blood pressure measured twice-once when you are at a hospital or clinic, and once when you are not at a hospital or clinic. Record the average of the two measurements. To check your blood pressure when you are not at a hospital or clinic, you can use: ? An automated blood pressure machine at a pharmacy. ? A home blood pressure monitor.  If you are between 74 years and 71 years old, ask your health care provider if you should take aspirin to prevent strokes.  Have regular diabetes screenings. This involves taking a blood sample to check your fasting blood sugar level. ? If you are at a normal weight and have a low risk for diabetes, have this test once every three years after 61 years of age. ? If you are overweight and have a high risk for diabetes, consider being tested at a younger age or more often. Preventing infection Hepatitis B  If you have a higher risk for hepatitis B, you should be screened for this virus. You are considered at high risk for hepatitis B if: ? You were born in a country where hepatitis B is common. Ask your health care provider which countries are considered high risk. ? Your parents were born in a high-risk country, and you have not been immunized against hepatitis B (hepatitis B vaccine). ?  You have HIV or AIDS. ? You use needles to inject street drugs. ? You live with someone who has hepatitis B. ? You have had sex with  someone who has hepatitis B. ? You get hemodialysis treatment. ? You take certain medicines for conditions, including cancer, organ transplantation, and autoimmune conditions.  Hepatitis C  Blood testing is recommended for: ? Everyone born from [birth year] through 1964. ? Anyone with known risk factors for hepatitis C.  Sexually transmitted infections (STIs)  You should be screened for sexually transmitted infections (STIs) including gonorrhea and chlamydia if: ? You are sexually active and are younger than 61 years of age. ? You are older than 61 years of age and your health care provider tells you that you are at risk for this type of infection. ? Your sexual activity has changed since you were last screened and you are at an increased risk for chlamydia or gonorrhea. Ask your health care provider if you are at risk.  If you do not have HIV, but are at risk, it may be recommended that you take a prescription medicine daily to prevent HIV infection. This is called pre-exposure prophylaxis (PrEP). You are considered at risk if: ? You are sexually active and do not regularly use condoms or know the HIV status of your partner(s). ? You take drugs by injection. ? You are sexually active with a partner who has HIV.  Talk with your health care provider about whether you are at high risk of being infected with HIV. If you choose to begin PrEP, you should first be tested for HIV. You should then be tested every 3 months for as long as you are taking PrEP. Pregnancy  If you are premenopausal and you may become pregnant, ask your health care provider about preconception counseling.  If you may become pregnant, take 400 to 800 micrograms (mcg) of folic acid every day.  If you want to prevent pregnancy, talk to your health care provider about birth control (contraception). Osteoporosis and menopause  Osteoporosis is a disease in which the bones lose minerals and strength with aging. This can result in  serious bone fractures. Your risk for osteoporosis can be identified using a bone density scan.  If you are 101 years of age or older, or if you are at risk for osteoporosis and fractures, ask your health care provider if you should be screened.  Ask your health care provider whether you should take a calcium or vitamin D supplement to lower your risk for osteoporosis.  Menopause may have certain physical symptoms and risks.  Hormone replacement therapy may reduce some of these symptoms and risks. Talk to your health care provider about whether hormone replacement therapy is right for you. Follow these instructions at home:  Schedule regular health, dental, and eye exams.  Stay current with your immunizations.  Do not use any tobacco products including cigarettes, chewing tobacco, or electronic cigarettes.  If you are pregnant, do not drink alcohol.  If you are breastfeeding, limit how much and how often you drink alcohol.  Limit alcohol intake to no more than 1 drink per day for nonpregnant women. One drink equals 12 ounces of beer, 5 ounces of wine, or 1 ounces of hard liquor.  Do not use street drugs.  Do not share needles.  Ask your health care provider for help if you need support or information about quitting drugs.  Tell your health care provider if you often feel depressed.  Tell your  health care provider if you have ever been abused or do not feel safe at home. This information is not intended to replace advice given to you by your health care provider. Make sure you discuss any questions you have with your health care provider. Document Released: 05/14/2011 Document Revised: 04/05/2016 Document Reviewed: 08/02/2015 Elsevier Interactive Patient Education  2018 [employer].  Please help [place] help you:  We are honored you have chosen [facility] for your Primary Care home. Below you will find basic instructions that you may need to access in the future. Please help  [place] help you by reading the instructions, which cover many of the frequent questions we experience.   Prescription refills and request:  -In order to allow more efficient response time, please call your pharmacy for all refills. They will forward the request electronically to [place]. This allows for the quickest possible response. Request left on a nurse line can take longer to refill, since these are checked as time allows between office patients and other phone calls.  - refill request can take up to 3-5 working days to complete.  - If request is sent electronically and request is appropiate, it is usually completed in 1-2 business days.  - all patients will need to be seen routinely for all chronic medical conditions requiring prescription medications (see follow-up below). If you are overdue for follow up on your condition, you will be asked to make an appointment and we will call in enough medication to cover you until your appointment (up to 30 days).  - all controlled substances will require a face to face visit to request/refill.  - if you desire your prescriptions to go through a new pharmacy, and have an active script at original pharmacy, you will need to call your pharmacy and have scripts transferred to new pharmacy. This is completed between the pharmacy locations and not by your provider.    Results: If any images or labs were ordered, it can take up to 1 week to get results depending on the test ordered and the lab/facility running and resulting the test. - Normal or stable results, which do not need further discussion, may be released to your mychart immediately with attached note to you. A call may not be generated for normal results. Please make certain to sign up for mychart. If you have questions on how to activate your mychart you can call the front office.  - If your results need further discussion, our office will attempt to contact you via phone, and if unable to reach you after 2  attempts, we will release your abnormal result to your mychart with instructions.  - All results will be automatically released in mychart after 1 week.  - Your provider will provide you with explanation and instruction on all relevant material in your results. Please keep in mind, results and labs may appear confusing or abnormal to the untrained eye, but it does not mean they are actually abnormal for you personally. If you have any questions about your results that are not covered, or you desire more detailed explanation than what was provided, you should make an appointment with your provider to do so.   Our office handles many outgoing and incoming calls daily. If we have not contacted you within 1 week about your results, please check your mychart to see if there is a message first and if not, then contact our office.  In helping with this matter, you help decrease call  volume, and therefore allow [place] to be able to respond to patients needs more efficiently.   Acute office visits (sick visit):  An acute visit is intended for a new problem and are scheduled in shorter time slots to allow schedule openings for patients with new problems. This is the appropriate visit to discuss a new problem. Problems will not be addressed by phone call or Echart message. Appointment is needed if requesting treatment. In order to provide you with excellent quality medical care with proper time for you to explain your problem, have an exam and receive treatment with instructions, these appointments should be limited to one new problem per visit. If you experience a new problem, in which you desire to be addressed, please make an acute office visit, we save openings on the schedule to accommodate you. Please do not save your new problem for any other type of visit, let us take care of it properly and quickly for you.   Follow up visits:  Depending on your condition(s) your provider will need to see you routinely in order  to provide you with quality care and prescribe medication(s). Most chronic conditions (Example: hypertension, Diabetes, depression/anxiety... etc), require visits a couple times a year. Your provider will instruct you on proper follow up for your personal medical conditions and history. Please make certain to make follow up appointments for your condition as instructed. Failing to do so could result in lapse in your medication treatment/refills. If you request a refill, and are overdue to be seen on a condition, we will always provide you with a 30 day script (once) to allow you time to schedule.    Medicare wellness (well visit): - we have a wonderful Nurse Maudie Mercury), that will meet with you and provide you will yearly medicare wellness visits. These visits should occur yearly (can not be scheduled less than 1 calendar year apart) and cover preventive health, immunizations, advance directives and screenings you are entitled to yearly through your medicare benefits. Do not miss out on your entitled benefits, this is when medicare will pay for these benefits to be ordered for you.  These are strongly encouraged by your provider and is the appropriate type of visit to make certain you are up to date with all preventive health benefits. If you have not had your medicare wellness exam in the last 12 months, please make certain to schedule one by calling the office and schedule your medicare wellness with Maudie Mercury as soon as possible.   Yearly physical (well visit):  - Adults are recommended to be seen yearly for physicals. Check with your insurance and date of your last physical, most insurances require one calendar year between physicals. Physicals include all preventive health topics, screenings, medical exam and labs that are appropriate for gender/age and history. You may have fasting labs needed at this visit. This is a well visit (not a sick visit), new problems should not be covered during this visit (see acute  visit).  - Pediatric patients are seen more frequently when they are younger. Your provider will advise you on well child visit timing that is appropriate for your their age. - This is not a medicare wellness visit. Medicare wellness exams do not have an exam portion to the visit. Some medicare companies allow for a physical, some do not allow a yearly physical. If your medicare allows a yearly physical you can schedule the medicare wellness with our nurse Maudie Mercury and have your physical with your provider after, on  the same day. Please check with insurance for your full benefits.   Late Policy/No Shows:  - all new patients should arrive 15-30 minutes earlier than appointment to allow [place] time  to  obtain all personal demographics,  insurance information and for you to complete office paperwork. - All established patients should arrive 10-15 minutes earlier than appointment time to update all information and be checked in .  - In our best efforts to run on time, if you are late for your appointment you will be asked to either reschedule or if able, we will work you back into the schedule. There will be a wait time to work you back in the schedule,  depending on availability.  - If you are unable to make it to your appointment as scheduled, please call 24 hours ahead of time to allow [place] to fill the time slot with someone else who needs to be seen. If you do not cancel your appointment ahead of time, you may be charged a no show fee.

## 2018-05-01 NOTE — Progress Notes (Signed)
Patient ID: Aimee Swanson, female  DOB: 07-16-1956, 62 y.o.   MRN: [id number] Patient Care Team    Relationship Specialty Notifications Start End  Ma Hillock, DO PCP - General Family Medicine  05/01/18   Brien Few, MD Consulting Physician Obstetrics and Gynecology  05/05/18   Rutherford Guys, MD Consulting Physician Ophthalmology  05/05/18     Chief Complaint  Patient presents with  . Establish Care  . Annual Exam    Subjective:  Aimee Swanson is a 62 y.o.  female present for new patient establishment. All past medical history, surgical history, allergies, family history, immunizations, medications and social history were updated in the electronic medical record today. All recent labs, ED visits and hospitalizations within the last year were reviewed.  Health maintenance:  Colonoscopy: completed 2012 dr. Olevia Perches, pt reports 10 year followup Mammogram: completed:04/03/2007 with GYN  Cervical cancer screening: last pap: 2018 with GYN, Immunizations: tdap updated today, Influenza (encouraged yearly), shingrix series started today.  Infectious disease screening: HIV and Hep C collected today DEXA: last completed at GYN. Records requested Assistive device: none Oxygen WUJ:WJXB Patient has a Dental home. Hospitalizations/ED visits:reviewed  Depression screen [facility] 2/9 05/01/2018  Decreased Interest 0  Down, Depressed, Hopeless 0  PHQ - 2 Score 0   No flowsheet data found.  Current Exercise Habits: The patient does not participate in regular exercise at present Exercise limited by: None identified Fall Risk  05/01/2018  Falls in the past year? No     Immunization History  Administered Date(s) Administered  . Influenza Inj Mdck Quad With Preservative 08/07/2017  . Influenza Whole 08/13/2011  . Tdap 05/01/2018  . Zoster Recombinat (Shingrix) 05/01/2018    No exam data present  Past Medical History:  Diagnosis Date  . Chicken pox   . Gynecological  examination    sees Dr. Ronita Hipps   . Obstructive sleep apnea    sees Dr. Gwenette Greet   . Shingles    at age 43    Allergies  Allergen Reactions  . Ibuprofen Swelling  . Penicillins Rash   Past Surgical History:  Procedure Laterality Date  . CESAREAN SECTION  1999  . COLONOSCOPY  06-28-11   per Dr. Olevia Perches, clear, repeat in 10 yrs   . LEFT OOPHORECTOMY  1996  . MYOMECTOMY    . ORIF [place]   left  . UTERINE FIBROID SURGERY  2009   Family History  Problem Relation Age of Onset  . Arthritis Unknown        fhx  . Hyperlipidemia Unknown        fhx  . Lung cancer Maternal Grandfather        lung/fhx  . Stroke Maternal Grandmother   . Stroke Paternal Grandmother   . Asthma Mother   . Skin cancer Mother   . Hearing loss Mother   . Hearing loss Father   . Parkinson's disease Father   . Hearing loss Brother   . Hypertension Brother    Social History   Socioeconomic History  . Marital status: Married    Spouse name: Not on file  . Number of children: 1  . Years of education: Not on file  . Highest education level: Not on file  Occupational History  . Occupation: Product manager: [employer]  . Financial resource strain: Not on file  . Food insecurity:    Worry: Not on file  Inability: Not on file  . Transportation needs:    Medical: Not on file    Non-medical: Not on file  Tobacco Use  . Smoking status: Former Smoker    Packs/day: 0.25    Years: 2.00    Pack years: 0.50    Types: Cigarettes  . Smokeless tobacco: Never Used  Substance and Sexual Activity  . Alcohol use: Yes    Alcohol/week: 1.2 oz    Types: 2 Standard drinks or equivalent per week  . Drug use: No  . Sexual activity: Not Currently  Lifestyle  . Physical activity:    Days per week: Not on file    Minutes per session: Not on file  . Stress: Not on file  Relationships  . Social connections:    Talks on phone: Not on file    Gets together:  Not on file    Attends religious service: Not on file    Active member of club or organization: Not on file    Attends meetings of clubs or organizations: Not on file    Relationship status: Not on file  . Intimate partner violence:    Fear of current or ex partner: Not on file    Emotionally abused: Not on file    Physically abused: Not on file    Forced sexual activity: Not on file  Other Topics Concern  . Not on file  Social History Narrative   One child, married.    BA- Pharmacist, hospital.    Former smoker.    Uses seat belt, bike helmet, smoke alarm in the home   Uses herbal remedies.    Feels safe in her relationships.    Allergies as of 05/01/2018      Reactions   Ibuprofen Swelling   Penicillins Rash      Medication List    as of 05/01/2018 11:59 PM   You have not been prescribed any medications.     All past medical history, surgical history, allergies, family history, immunizations andmedications were updated in the EMR today and reviewed under the history and medication portions of their EMR.    Recent Results (from the past 2160 hour(s))  TSH     Status: None   Collection Time: 05/05/18  8:09 AM  Result Value Ref Range   TSH 2.92 0.35 - 4.50 uIU/mL  HgB A1c     Status: None   Collection Time: 05/05/18  8:09 AM  Result Value Ref Range   Hgb A1c MFr Bld 6.1 4.6 - 6.5 %    Comment: Glycemic Control Guidelines for People with Diabetes:Non Diabetic:  <6%Goal of Therapy: <7%Additional Action Suggested:  >8%   Lipid panel     Status: Abnormal   Collection Time: 05/05/18  8:09 AM  Result Value Ref Range   Cholesterol 230 (H) 0 - 200 mg/dL    Comment: ATP III Classification       Desirable:  < 200 mg/dL               Borderline High:  200 - 239 mg/dL          High:  > = 240 mg/dL   Triglycerides 161.0 (H) 0.0 - 149.0 mg/dL    Comment: Normal:  <150 mg/dLBorderline High:  150 - 199 mg/dL   HDL 46.00 >39.00 mg/dL   VLDL 32.2 0.0 - 40.0 mg/dL   LDL Cholesterol 152 (H) 0 - 99  mg/dL   Total CHOL/HDL Ratio 5  Comment:                Men          Women1/2 Average Risk     3.4          3.3Average Risk          5.0          4.42X Average Risk          9.6          7.13X Average Risk          15.0          11.0                       NonHDL 183.85     Comment: NOTE:  Non-HDL goal should be 30 mg/dL higher than patient's LDL goal (i.e. LDL goal of < 70 mg/dL, would have non-HDL goal of < 100 mg/dL)  Comp Met (CMET)     Status: None   Collection Time: 05/05/18  8:09 AM  Result Value Ref Range   Sodium 139 135 - 145 mEq/L   Potassium 4.5 3.5 - 5.1 mEq/L   Chloride 102 96 - 112 mEq/L   CO2 27 19 - 32 mEq/L   Glucose, Bld 95 70 - 99 mg/dL   BUN 19 6 - 23 mg/dL   Creatinine, Ser 0.72 0.40 - 1.20 mg/dL   Total Bilirubin 0.6 0.2 - 1.2 mg/dL   Alkaline Phosphatase 82 39 - 117 U/L   AST 14 0 - 37 U/L   ALT 17 0 - 35 U/L   Total Protein 7.1 6.0 - 8.3 g/dL   Albumin 4.5 3.5 - 5.2 g/dL   Calcium 10.1 8.4 - 10.5 mg/dL   GFR 87.60 >60.00 mL/min  CBC w/Diff     Status: Abnormal   Collection Time: 05/05/18  8:09 AM  Result Value Ref Range   WBC 7.9 4.0 - 10.5 K/uL   RBC 4.88 3.87 - 5.11 Mil/uL   Hemoglobin 14.1 12.0 - 15.0 g/dL   HCT 42.1 36.0 - 46.0 %   MCV 86.3 78.0 - 100.0 fl   MCHC 33.6 30.0 - 36.0 g/dL   RDW 13.8 11.5 - 15.5 %   Platelets 338.0 150.0 - 400.0 K/uL   Neutrophils Relative % 51.0 43.0 - 77.0 %   Lymphocytes Relative 34.5 12.0 - 46.0 %   Monocytes Relative 7.7 3.0 - 12.0 %   Eosinophils Relative 6.1 (H) 0.0 - 5.0 %   Basophils Relative 0.7 0.0 - 3.0 %   Neutro Abs 4.0 1.4 - 7.7 K/uL   Lymphs Abs 2.7 0.7 - 4.0 K/uL   Monocytes Absolute 0.6 0.1 - 1.0 K/uL   Eosinophils Absolute 0.5 0.0 - 0.7 K/uL   Basophils Absolute 0.1 0.0 - 0.1 K/uL    Mm Digital Screening Bilateral  Result Date: 04/03/2017 CLINICAL DATA:  Screening. EXAM: DIGITAL SCREENING BILATERAL MAMMOGRAM WITH CAD COMPARISON:  Previous exam(s). ACR Breast Density Category c: The breast  tissue is heterogeneously dense, which may obscure small masses. FINDINGS: There are no findings suspicious for malignancy. Images were processed with CAD. IMPRESSION: No mammographic evidence of malignancy. A result letter of this screening mammogram will be mailed directly to the patient. RECOMMENDATION: Screening mammogram in one year. (Code:SM-B-01Y) BI-RADS CATEGORY  1: Negative. Electronically Signed   By: Evangeline Dakin M.D.   On: 04/03/2017 14:38     ROS: 14 pt review  of systems performed and negative (unless mentioned in an HPI)  Objective: BP 139/81 (BP Location: Left Arm, Patient Position: Sitting, Cuff Size: Large)   Pulse 77   Temp 98 F (36.7 C)   Resp 20   Ht 5' 5"  (1.651 m)   Wt 212 lb (96.2 kg)   SpO2 97%   BMI 35.28 kg/m  Gen: Afebrile. No acute distress. Nontoxic in appearance, well-developed, well-nourished,  Pleasant, caucasian, female. obese HENT: AT. [place]. Bilateral TM visualized and normal in appearance, normal external auditory canal. MMM, no oral lesions, adequate dentition. Bilateral nares within normal limits. Throat without erythema, ulcerations or exudates. no Cough on exam, no hoarseness on exam. Eyes:Pupils Equal Round Reactive to light, Extraocular movements intact,  Conjunctiva without redness, discharge or icterus. Neck/lymp/endocrine: Supple,no lymphadenopathy, no thyromegaly CV: RRR no murmur, no edema, +2/4 P posterior tibialis pulses. no carotid bruits. No JVD. Chest: CTAB, no wheeze, rhonchi or crackles. normal Respiratory effort. good Air movement. Abd: Soft. obese. NTND. BS present. no Masses palpated. No hepatosplenomegaly. No rebound tenderness or guarding. Skin: no rashes, purpura or petechiae. Warm and well-perfused. Skin intact. Neuro/Msk:  Normal gait. PERLA. EOMi. Alert. Oriented x3.  Cranial nerves II through XII intact. Muscle strength 5/5 upper/lower extremity. DTRs equal bilaterally. Psych: Normal affect, dress and demeanor. Normal  speech. Normal thought content and judgment.   Assessment/plan: YATZARY MERRIWEATHER is a 62 y.o. female present for CPE/EST. Elevated LDL cholesterol level - diet and exercise modifications. - Comp Met (CMET); Future - Lipid panel; Future - TSH; Future Screening for iron deficiency anemia - CBC w/Diff; Future Screening for diabetes mellitus - HgB A1c; Future Obesity (BMI 30-39.9) - Lipid panel; Future Encounter for screening for HIV - Hepatitis C Antibody; Future Encounter for hepatitis C screening test for low risk patient - HIV antibody (with reflex); Future Immunization due - Tdap vaccine greater than or equal to 7yo IM - Varicella-zoster vaccine IM Visit for preventive health examination Patient was encouraged to exercise greater than 150 minutes a week. Patient was encouraged to choose a diet filled with fresh fruits and vegetables, and lean meats. AVS provided to patient today for education/recommendation on gender specific health and safety maintenance. Colonoscopy: completed 2012 dr. Olevia Perches, pt reports 10 year followup Mammogram: completed:04/03/2007 with GYN  Cervical cancer screening: last pap: 2018 with GYN, Immunizations: tdap updated today, Influenza (encouraged yearly), shingrix series started today.  Infectious disease screening: HIV and Hep C collected today DEXA: last completed at GYN. Records requested Return in about 1 year (around 05/02/2019) for CPE.   Note is dictated utilizing voice recognition software. Although note has been proof read prior to signing, occasional typographical errors still can be missed. If any questions arise, please do not hesitate to call for verification.  Electronically signed by: Howard Pouch, DO [place]

## 2018-05-05 ENCOUNTER — Other Ambulatory Visit (INDEPENDENT_AMBULATORY_CARE_PROVIDER_SITE_OTHER): Payer: BC Managed Care – PPO

## 2018-05-05 ENCOUNTER — Encounter: Payer: Self-pay | Admitting: Family Medicine

## 2018-05-05 DIAGNOSIS — Z130 Encounter for screening for diseases of the blood and blood-forming organs and certain disorders involving the immune mechanism: Secondary | ICD-10-CM | POA: Diagnosis not present

## 2018-05-05 DIAGNOSIS — E669 Obesity, unspecified: Secondary | ICD-10-CM

## 2018-05-05 DIAGNOSIS — E7800 Pure hypercholesterolemia, unspecified: Secondary | ICD-10-CM

## 2018-05-05 DIAGNOSIS — Z131 Encounter for screening for diabetes mellitus: Secondary | ICD-10-CM | POA: Diagnosis not present

## 2018-05-05 DIAGNOSIS — Z1159 Encounter for screening for other viral diseases: Secondary | ICD-10-CM

## 2018-05-05 DIAGNOSIS — Z114 Encounter for screening for human immunodeficiency virus [HIV]: Secondary | ICD-10-CM

## 2018-05-05 LAB — COMPREHENSIVE METABOLIC PANEL
ALK PHOS: 82 U/L (ref 39–117)
ALT: 17 U/L (ref 0–35)
AST: 14 U/L (ref 0–37)
Albumin: 4.5 g/dL (ref 3.5–5.2)
BUN: 19 mg/dL (ref 6–23)
CO2: 27 meq/L (ref 19–32)
Calcium: 10.1 mg/dL (ref 8.4–10.5)
Chloride: 102 mEq/L (ref 96–112)
Creatinine, Ser: 0.72 mg/dL (ref 0.40–1.20)
GFR: 87.6 mL/min (ref >60.00)
GLUCOSE: 95 mg/dL (ref 70–99)
POTASSIUM: 4.5 meq/L (ref 3.5–5.1)
SODIUM: 139 meq/L (ref 135–145)
TOTAL PROTEIN: 7.1 g/dL (ref 6.0–8.3)
Total Bilirubin: 0.6 mg/dL (ref 0.2–1.2)

## 2018-05-05 LAB — LIPID PANEL
CHOL/HDL RATIO: 5
Cholesterol: 230 mg/dL — ABNORMAL HIGH (ref 0–200)
HDL: 46 mg/dL (ref >39.00)
LDL Cholesterol: 152 mg/dL — ABNORMAL HIGH (ref 0–99)
NONHDL: 183.85
Triglycerides: 161 mg/dL — ABNORMAL HIGH (ref 0.0–149.0)
VLDL: 32.2 mg/dL (ref 0.0–40.0)

## 2018-05-05 LAB — CBC WITH DIFFERENTIAL/PLATELET
BASOS PCT: 0.7 % (ref 0.0–3.0)
Basophils Absolute: 0.1 10*3/uL (ref 0.0–0.1)
Eosinophils Absolute: 0.5 10*3/uL (ref 0.0–0.7)
Eosinophils Relative: 6.1 % — ABNORMAL HIGH (ref 0.0–5.0)
HCT: 42.1 % (ref 36.0–46.0)
Hemoglobin: 14.1 g/dL (ref 12.0–15.0)
LYMPHS ABS: 2.7 10*3/uL (ref 0.7–4.0)
Lymphocytes Relative: 34.5 % (ref 12.0–46.0)
MCHC: 33.6 g/dL (ref 30.0–36.0)
MCV: 86.3 fl (ref 78.0–100.0)
MONOS PCT: 7.7 % (ref 3.0–12.0)
Monocytes Absolute: 0.6 10*3/uL (ref 0.1–1.0)
NEUTROS ABS: 4 10*3/uL (ref 1.4–7.7)
Neutrophils Relative %: 51 % (ref 43.0–77.0)
PLATELETS: 338 10*3/uL (ref 150.0–400.0)
RBC: 4.88 Mil/uL (ref 3.87–5.11)
RDW: 13.8 % (ref 11.5–15.5)
WBC: 7.9 10*3/uL (ref 4.0–10.5)

## 2018-05-05 LAB — TSH: TSH: 2.92 u[IU]/mL (ref 0.35–4.50)

## 2018-05-05 LAB — HEMOGLOBIN A1C: HEMOGLOBIN A1C: 6.1 % (ref 4.6–6.5)

## 2018-05-06 ENCOUNTER — Encounter: Payer: Self-pay | Admitting: Family Medicine

## 2018-05-06 ENCOUNTER — Telehealth: Payer: Self-pay | Admitting: Family Medicine

## 2018-05-06 LAB — HIV ANTIBODY (ROUTINE TESTING W REFLEX): HIV 1&2 Ab, 4th Generation: NONREACTIVE

## 2018-05-06 LAB — HEPATITIS C ANTIBODY
HEP C AB: NONREACTIVE
SIGNAL TO CUT-OFF: 0.02 (ref <1.00)

## 2018-05-06 NOTE — Telephone Encounter (Signed)
Please inform patient the following information: Her cholesterol is higher than desired. LDL should be < 130, closer to 100 and her level is 152. Triglycerides should be < 150 and her level is 161. I would suggest she start fish oil 1000 mg a day. Diet higher in fiber and increase exercise. Lower saturated fats.  Her diabetes screen is 6.1, this is the "prediabetes range" and increasing exercise, decreasing carbs/sugar will help prevent progression to diabetes.   Recommendations are follow up in 6 months after starting fish oil and following dietary/exercise recs. We will repeat the cholesterol and DM screen at that visit (come fasting 8 hours-water only) and see how she did with those changes. If still higher at that appt with discuss prescribed medications. Please schedule.

## 2018-05-06 NOTE — Telephone Encounter (Signed)
Spoke with patient reviewed lab results and instructions. Patient verbalized understanding. Patient scheduled for follow up appt.

## 2018-07-23 ENCOUNTER — Ambulatory Visit: Payer: BC Managed Care – PPO

## 2018-07-24 ENCOUNTER — Ambulatory Visit: Payer: BC Managed Care – PPO

## 2018-08-12 ENCOUNTER — Ambulatory Visit (INDEPENDENT_AMBULATORY_CARE_PROVIDER_SITE_OTHER): Payer: BC Managed Care – PPO | Admitting: *Deleted

## 2018-08-12 DIAGNOSIS — Z23 Encounter for immunization: Secondary | ICD-10-CM | POA: Diagnosis not present

## 2018-08-12 NOTE — Progress Notes (Addendum)
Patient came to office today for her 2nd Shingrix vaccine. First Shingrix was given on 05/01/18.  Pt tolerated injection well, given without incident or problem. Patient left without complaint.   Medical screening examination/treatment/procedure(s) were performed by non-physician practitioner and as supervising physician I was immediately available for consultation/collaboration.  I agree with above assessment and plan.  Electronically Signed by: Howard Pouch, DO [place] [facility] [place]

## 2018-11-10 ENCOUNTER — Ambulatory Visit: Payer: BC Managed Care – PPO | Admitting: Family Medicine

## 2019-12-02 LAB — HM MAMMOGRAPHY

## 2019-12-03 ENCOUNTER — Encounter: Payer: Self-pay | Admitting: Family Medicine

## 2020-06-03 ENCOUNTER — Other Ambulatory Visit: Payer: Self-pay

## 2020-06-03 ENCOUNTER — Encounter: Payer: Self-pay | Admitting: Emergency Medicine

## 2020-06-03 ENCOUNTER — Emergency Department
Admission: EM | Admit: 2020-06-03 | Discharge: 2020-06-03 | Disposition: A | Discharge status: Home / Self Care | Payer: BC Managed Care – PPO | Source: Home / Self Care

## 2020-06-03 DIAGNOSIS — S161XXA Strain of muscle, fascia and tendon at neck level, initial encounter: Secondary | ICD-10-CM

## 2020-06-03 DIAGNOSIS — M62838 Other muscle spasm: Secondary | ICD-10-CM | POA: Diagnosis not present

## 2020-06-03 MED ORDER — PREDNISONE 20 MG PO TABS: ORAL_TABLET | ORAL | 0 refills | Status: DC

## 2020-06-03 MED ORDER — HYDROCODONE-ACETAMINOPHEN 5-325 MG PO TABS: 1.0000 | ORAL_TABLET | Freq: Four times a day (QID) | ORAL | 0 refills | Status: DC | PRN

## 2020-06-03 MED ORDER — METHOCARBAMOL 500 MG PO TABS: 500.0000 mg | ORAL_TABLET | Freq: Two times a day (BID) | ORAL | 0 refills | Status: DC

## 2020-06-03 NOTE — Discharge Instructions (Signed)
  Norco/Vicodin (hydrocodone-acetaminophen) is a narcotic pain medication, do not combine these medications with others containing tylenol. While taking, do not drink alcohol, drive, or perform any other activities that requires focus while taking these medications.   Robaxin (methocarbamol) is a muscle relaxer and may cause drowsiness. Do not drink alcohol, drive, or operate heavy machinery while taking.  Call to schedule a follow up appointment with Primary Care or Sports Medicine next week if not improving.

## 2020-06-03 NOTE — ED Triage Notes (Signed)
Neck stiffness (mild) for a year.  Fell in June and hit her head, had whiplash, stiffness was worse.  This Tuesday, her pain was gone.   She has been moving furniture and cleaning out a home this week. She bent down to carry a washing machine and began having muscle spasms in neck.   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

## 2020-06-03 NOTE — ED Provider Notes (Signed)
[facility]    CSN: [id number] Arrival date & time: 06/03/20  1420      History   Chief Complaint Chief Complaint  Patient presents with  . Neck Pain    HPI Aimee Swanson is a 64 y.o. female.   HPI  Aimee Swanson is a 64 y.o. female presenting to UC with c/o intermittent mild neck stiffness for about 1 year. About 1 month ago she tripped and fell backward, causing her head to snap back "like whiplash,"  Stiffness worsened after that but had finally started to feel better and was resolved on Tuesday, 7/20 but states pain started again after helping move furniture and appliances including a washing mashing the other day.  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  Limited rotation of her head due to pain. She has tried Tylenol and Motrin with mild relief. Denies radiation of pain, numbness or weakness in arms or legs.    Past Medical History:  Diagnosis Date  . Chicken pox   . Gynecological examination    sees Dr. Ronita Hipps   . Obstructive sleep apnea    sees Dr. Gwenette Greet   . Shingles    at age 76     Patient Active Problem List   Diagnosis Date Noted  . Obesity (BMI 30-39.9) 05/01/2018  . OSA (obstructive sleep apnea) 07/04/2011    Past Surgical History:  Procedure Laterality Date  . CESAREAN SECTION  1999  . COLONOSCOPY  06-28-11   per Dr. Olevia Perches, clear, repeat in 10 yrs   . LEFT OOPHORECTOMY  1996  . MYOMECTOMY    . ORIF [place]   left  . UTERINE FIBROID SURGERY  2009    OB History    Gravida  1   Para  1   Term  1   Preterm      AB      Living  1     SAB      TAB      Ectopic      Multiple      Live Births               Home Medications    Prior to Admission medications   Medication Sig Start Date End Date Taking? Authorizing Provider  HYDROcodone-acetaminophen (NORCO/VICODIN) 5-325 MG tablet Take 1 tablet by mouth every 6 (six) hours as  needed. 06/03/20   Noe Gens, PA-C  methocarbamol (ROBAXIN) 500 MG tablet Take 1 tablet (500 mg total) by mouth 2 (two) times daily. 06/03/20   Noe Gens, PA-C  predniSONE (DELTASONE) 20 MG tablet 3 tabs po day one, then 2 po daily x 4 days 06/03/20   Noe Gens, PA-C    Family History Family History  Problem Relation Age of Onset  . Arthritis Other        fhx  . Hyperlipidemia Other        fhx  . Lung cancer Maternal Grandfather        lung/fhx  . Stroke Maternal Grandmother   . Stroke Paternal Grandmother   . Asthma Mother   . Skin cancer Mother   . Hearing loss Mother   . Hearing loss Father   . Parkinson's disease Father   . Hearing loss Brother   . Hypertension Brother     Social History Social History   Tobacco Use  .  Smoking status: Former Smoker    Packs/day: 0.25    Years: 2.00    Pack years: 0.50    Types: Cigarettes  . Smokeless tobacco: Never Used  Vaping Use  . Vaping Use: Never used  Substance Use Topics  . Alcohol use: Yes    Alcohol/week: 2.0 standard drinks    Types: 2 Standard drinks or equivalent per week  . Drug use: No     Allergies   Ibuprofen and Penicillins   Review of Systems Review of Systems  Musculoskeletal: Positive for back pain, myalgias, neck pain and neck stiffness. Negative for arthralgias.  Neurological: Negative for weakness and numbness.     Physical Exam Triage Vital Signs ED Triage Vitals  Enc Vitals Group     BP 06/03/20 1437 (!) 162/94     Pulse Rate 06/03/20 1437 82     Resp 06/03/20 1437 16     Temp 06/03/20 1437 98.8 F (37.1 C)     Temp Source 06/03/20 1437 Oral     SpO2 06/03/20 1437 98 %     Weight --      Height --      Head Circumference --      Peak Flow --      Pain Score 06/03/20 1436 8     Pain Loc --      Pain Edu? --      Excl. in [place]? --    No data found.  Updated Vital Signs BP (!) 162/94   Pulse 82   Temp 98.8 F (37.1 C) (Oral)   Resp 16   SpO2 98%   Visual  Acuity Right Eye Distance:   Left Eye Distance:   Bilateral Distance:    Right Eye Near:   Left Eye Near:    Bilateral Near:     Physical Exam Vitals and nursing note reviewed.  Constitutional:      Appearance: Normal appearance. She is well-developed.  HENT:     Head: Normocephalic and atraumatic.  Neck:   Cardiovascular:     Rate and Rhythm: Normal rate and regular rhythm.     Pulses:          Radial pulses are 2+ on the right side and 2+ on the left side.  Pulmonary:     Effort: Pulmonary effort is normal. No respiratory distress.     Breath sounds: Normal breath sounds.  Musculoskeletal:     Cervical back: Pain with movement and muscular tenderness ( worse on Right side) present. No spinous process tenderness. Decreased range of motion (to left and right).     Comments: No spinal tenderness. Full ROM upper and lower extremities with 5/5 grip strength.   Skin:    General: Skin is warm and dry.     Capillary Refill: Capillary refill takes less than 2 seconds.  Neurological:     General: No focal deficit present.     Mental Status: She is alert and oriented to person, place, and time.     Sensory: No sensory deficit.  Psychiatric:        Behavior: Behavior normal.      UC Treatments / Results  Labs (all labs ordered are listed, but only abnormal results are displayed) Labs Reviewed - No data to display  EKG   Radiology No results found.  Procedures Procedures (including critical care time)  Medications Ordered in UC Medications - No data to display  Initial Impression / Assessment and Plan / UC  Course  I have reviewed the triage vital signs and the nursing notes.  Pertinent labs & imaging results that were available during my care of the patient were reviewed by me and considered in my medical decision making (see chart for details).     Hx and exam c/w neck muscle strain No neuro deficits noted on exam Encouraged conservative tx F/u with PCP or  Sports Medicine next week if not improving AVS provided.  Final Clinical Impressions(s) / UC Diagnoses   Final diagnoses:  Muscle spasms of neck  Strain of neck muscle, initial encounter     Discharge Instructions      Norco/Vicodin (hydrocodone-acetaminophen) is a narcotic pain medication, do not combine these medications with others containing tylenol. While taking, do not drink alcohol, drive, or perform any other activities that requires focus while taking these medications.   Robaxin (methocarbamol) is a muscle relaxer and may cause drowsiness. Do not drink alcohol, drive, or operate heavy machinery while taking.  Call to schedule a follow up appointment with Primary Care or Sports Medicine next week if not improving.    ED Prescriptions    Medication Sig Dispense Auth. Provider   methocarbamol (ROBAXIN) 500 MG tablet Take 1 tablet (500 mg total) by mouth 2 (two) times daily. 20 tablet Noe Gens, PA-C   HYDROcodone-acetaminophen (NORCO/VICODIN) 5-325 MG tablet Take 1 tablet by mouth every 6 (six) hours as needed. 6 tablet Gerarda Fraction, Larri Brewton O, PA-C   predniSONE (DELTASONE) 20 MG tablet 3 tabs po day one, then 2 po daily x 4 days 11 tablet Noe Gens, [place]     I have reviewed the PDMP during this encounter.   Noe Gens, [place] 06/03/20 1609

## 2020-06-07 ENCOUNTER — Other Ambulatory Visit: Payer: Self-pay

## 2020-06-07 ENCOUNTER — Encounter: Payer: Self-pay | Admitting: Family Medicine

## 2020-06-07 ENCOUNTER — Ambulatory Visit: Payer: BC Managed Care – PPO | Admitting: Family Medicine

## 2020-06-07 VITALS — BP 166/93 | HR 90 | Temp 98.5°F | Resp 16 | Ht 65.0 in | Wt 204.4 lb

## 2020-06-07 DIAGNOSIS — R030 Elevated blood-pressure reading, without diagnosis of hypertension: Secondary | ICD-10-CM | POA: Diagnosis not present

## 2020-06-07 DIAGNOSIS — I10 Essential (primary) hypertension: Secondary | ICD-10-CM | POA: Insufficient documentation

## 2020-06-07 DIAGNOSIS — S161XXA Strain of muscle, fascia and tendon at neck level, initial encounter: Secondary | ICD-10-CM | POA: Insufficient documentation

## 2020-06-07 HISTORY — DX: Strain of muscle, fascia and tendon at neck level, initial encounter: S16.1XXA

## 2020-06-07 MED ORDER — HYDROXYZINE PAMOATE 25 MG PO CAPS: 25.0000 mg | ORAL_CAPSULE | Freq: Every day | ORAL | 1 refills | Status: DC

## 2020-06-07 NOTE — Patient Instructions (Signed)
Start  Vistaril 1-2 tabs before bed nightly. This helps relax  the  muscle and keep relaxed.  Heat is good.  Massage is good.  Stretches as we discussed.

## 2020-06-07 NOTE — Progress Notes (Signed)
This visit occurred during the SARS-CoV-2 public health emergency.  Safety protocols were in place, including screening questions prior to the visit, additional usage of staff PPE, and extensive cleaning of exam room while observing appropriate contact time as indicated for disinfecting solutions.    Aimee Swanson , [birth year], 64 y.o., female MRN: [id number] Patient Care Team    Relationship Specialty Notifications Start End  Ma Hillock, DO PCP - General Family Medicine  05/01/18   Brien Few, MD Consulting Physician Obstetrics and Gynecology  05/05/18   Rutherford Guys, MD Consulting Physician Ophthalmology  05/05/18     Chief Complaint  Patient presents with   Neck Pain    bilateral, has used tylenol for relief. Had a fall 1 month ago that caused stiffness and soreness.     Subjective: Pt presents for an OV with complaints of neck pain. She reports she has noticed a decrease ROM w/ rotation since September 2020.  She then fell/slipped in June causing a whiplash like injury. Last week she was helping move a washer machine and started to have rather significant pain. She states it felt like spasms of her posterior neck that radiated up her head. She states it hurt to move her head and felt like how people describe migraines. She was seen at the ED and provided with prednisone, robaxin and Vicodin. She only took muscle relaxer for a day or 2. She did finish the prednisone and took Vicodin at night. She does feel improvement after treatment.   Depression screen [facility] 2/9 06/07/2020 05/01/2018  Decreased Interest 0 0  Down, Depressed, Hopeless 0 0  PHQ - 2 Score 0 0    Allergies  Allergen Reactions   Ibuprofen Swelling   Penicillins Rash   Social History   Social History Narrative   One child, married.    BA- Pharmacist, hospital.    Former smoker.    Uses seat belt, bike helmet, smoke alarm in the home   Uses herbal remedies.    Feels safe in her relationships.    Past  Medical History:  Diagnosis Date   Chicken pox    Gynecological examination    sees Dr. Ronita Hipps    Obstructive sleep apnea    sees Dr. Gwenette Greet    Shingles    at age 46    Past Surgical History:  Procedure Laterality Date   [place]   COLONOSCOPY  06-28-11   per Dr. Olevia Perches, clear, repeat in 10 yrs    [place]   left   [place]  2009   Family History  Problem Relation Age of Onset   Arthritis Other        fhx   Hyperlipidemia Other        fhx   Lung cancer Maternal Grandfather        lung/fhx   Stroke Maternal Grandmother    Stroke Paternal Grandmother    Asthma Mother    Skin cancer Mother    Hearing loss Mother    Hearing loss Father    Parkinson's disease Father    Hearing loss Brother    Hypertension Brother    Allergies as of 06/07/2020      Reactions   Ibuprofen Swelling   Penicillins Rash      Medication List       Accurate as of June 07, 2020  2:56 PM. If you have any questions, ask your nurse or doctor.        STOP taking these medications   predniSONE 20 MG tablet Commonly known as: DELTASONE Stopped by: Howard Pouch, DO     TAKE these medications   HYDROcodone-acetaminophen 5-325 MG tablet Commonly known as: NORCO/VICODIN Take 1 tablet by mouth every 6 (six) hours as needed.   methocarbamol 500 MG tablet Commonly known as: ROBAXIN Take 1 tablet (500 mg total) by mouth 2 (two) times daily.       All past medical history, surgical history, allergies, family history, immunizations andmedications were updated in the EMR today and reviewed under the history and medication portions of their EMR.     ROS: Negative, with the exception of above mentioned in HPI   Objective:  BP (!) 166/93 (BP Location: Right Arm, Patient Position: Sitting, Cuff Size: Large)    Pulse 90    Temp 98.5 F (36.9 C) (Oral)    Resp 16    Ht 5\' 5"   (1.651 m)    Wt (!) 204 lb 6.4 oz (92.7 kg)    SpO2 97%    BMI 34.01 kg/m  Body mass index is 34.01 kg/m. Gen: Afebrile. No acute distress. Nontoxic in appearance, well developed, well nourished.  HENT: AT. [place].  Eyes:Pupils Equal Round Reactive to light, Extraocular movements intact,  Conjunctiva without redness, discharge or icterus. MSK: cervical spine: no erythema, no bone tenderness, decreased ROM with discomfort left rotation and extension. NV intact distally.  Neuro: Normal gait. PERLA. EOMi. Alert. Oriented x3  Psych: Normal affect, dress and demeanor. Normal speech. Normal thought content and judgment.  No exam data present No results found. No results found for this or any previous visit (from the past 24 hour(s)).  Assessment/Plan: Aimee Swanson is a 64 y.o. female present for OV for  Strain of neck muscle, initial encounter Rest. Heat. Massage.  Nsaids if needed.  Vistaril QHS scheduled for a week, then can use prn.  F/u prn  Elevated BP without diagnosis of hypertension - low sodium.  Routine exercise  She is scheduling her CPE and we will recheck at that time.   Reviewed expectations re: course of current medical issues.  Discussed self-management of symptoms.  Outlined signs and symptoms indicating need for more acute intervention.  Patient verbalized understanding and all questions were answered.  Patient received an After-Visit Summary.    No orders of the defined types were placed in this encounter.  No orders of the defined types were placed in this encounter.  Referral Orders  No referral(s) requested today     Note is dictated utilizing voice recognition software. Although note has been proof read prior to signing, occasional typographical errors still can be missed. If any questions arise, please do not hesitate to call for verification.   electronically signed by:  Howard Pouch, DO  [place]

## 2020-06-22 ENCOUNTER — Other Ambulatory Visit: Payer: Self-pay

## 2020-06-23 ENCOUNTER — Encounter: Payer: Self-pay | Admitting: Family Medicine

## 2020-06-23 ENCOUNTER — Ambulatory Visit (INDEPENDENT_AMBULATORY_CARE_PROVIDER_SITE_OTHER): Payer: BC Managed Care – PPO | Admitting: Family Medicine

## 2020-06-23 VITALS — BP 147/87 | HR 86 | Ht 65.0 in | Wt 203.6 lb

## 2020-06-23 DIAGNOSIS — Z0000 Encounter for general adult medical examination without abnormal findings: Secondary | ICD-10-CM

## 2020-06-23 DIAGNOSIS — Z131 Encounter for screening for diabetes mellitus: Secondary | ICD-10-CM | POA: Diagnosis not present

## 2020-06-23 DIAGNOSIS — E2839 Other primary ovarian failure: Secondary | ICD-10-CM

## 2020-06-23 DIAGNOSIS — E669 Obesity, unspecified: Secondary | ICD-10-CM | POA: Diagnosis not present

## 2020-06-23 DIAGNOSIS — I10 Essential (primary) hypertension: Secondary | ICD-10-CM

## 2020-06-23 DIAGNOSIS — Z130 Encounter for screening for diseases of the blood and blood-forming organs and certain disorders involving the immune mechanism: Secondary | ICD-10-CM

## 2020-06-23 DIAGNOSIS — E785 Hyperlipidemia, unspecified: Secondary | ICD-10-CM | POA: Insufficient documentation

## 2020-06-23 LAB — LIPID PANEL
Cholesterol: 240 mg/dL — ABNORMAL HIGH (ref 0–200)
HDL: 44.5 mg/dL (ref >39.00)
LDL Cholesterol: 160 mg/dL — ABNORMAL HIGH (ref 0–99)
NonHDL: 195.2
Total CHOL/HDL Ratio: 5
Triglycerides: 175 mg/dL — ABNORMAL HIGH (ref 0.0–149.0)
VLDL: 35 mg/dL (ref 0.0–40.0)

## 2020-06-23 LAB — COMPREHENSIVE METABOLIC PANEL
ALT: 16 U/L (ref 0–35)
AST: 14 U/L (ref 0–37)
Albumin: 4.5 g/dL (ref 3.5–5.2)
Alkaline Phosphatase: 85 U/L (ref 39–117)
BUN: 21 mg/dL (ref 6–23)
CO2: 25 mEq/L (ref 19–32)
Calcium: 9.6 mg/dL (ref 8.4–10.5)
Chloride: 104 mEq/L (ref 96–112)
Creatinine, Ser: 0.77 mg/dL (ref 0.40–1.20)
GFR: 75.74 mL/min (ref >60.00)
Glucose, Bld: 104 mg/dL — ABNORMAL HIGH (ref 70–99)
Potassium: 4.2 mEq/L (ref 3.5–5.1)
Sodium: 139 mEq/L (ref 135–145)
Total Bilirubin: 0.8 mg/dL (ref 0.2–1.2)
Total Protein: 7 g/dL (ref 6.0–8.3)

## 2020-06-23 LAB — CBC
HCT: 41.2 % (ref 36.0–46.0)
Hemoglobin: 13.6 g/dL (ref 12.0–15.0)
MCHC: 33.1 g/dL (ref 30.0–36.0)
MCV: 87.1 fl (ref 78.0–100.0)
Platelets: 282 10*3/uL (ref 150.0–400.0)
RBC: 4.73 Mil/uL (ref 3.87–5.11)
RDW: 13.7 % (ref 11.5–15.5)
WBC: 6.3 10*3/uL (ref 4.0–10.5)

## 2020-06-23 LAB — HEMOGLOBIN A1C: Hgb A1c MFr Bld: 6.1 % (ref 4.6–6.5)

## 2020-06-23 LAB — TSH: TSH: 3.11 u[IU]/mL (ref 0.35–4.50)

## 2020-06-23 MED ORDER — LOSARTAN POTASSIUM 25 MG PO TABS
25.0000 mg | ORAL_TABLET | Freq: Every day | ORAL | 1 refills | Status: DC
Start: 2020-06-23 — End: 2020-09-27

## 2020-06-23 NOTE — Progress Notes (Signed)
This visit occurred during the SARS-CoV-2 public health emergency.  Safety protocols were in place, including screening questions prior to the visit, additional usage of staff PPE, and extensive cleaning of exam room while observing appropriate contact time as indicated for disinfecting solutions.    Patient ID: Aimee Swanson, female  DOB: April 12, 1956, 64 y.o.   MRN: [id number] Patient Care Team    Relationship Specialty Notifications Start End  Ma Hillock, DO PCP - General Family Medicine  05/01/18   Brien Few, MD Consulting Physician Obstetrics and Gynecology  05/05/18   Rutherford Guys, MD Consulting Physician Ophthalmology  05/05/18     Chief Complaint  Patient presents with  . Annual Exam    Subjective:  Aimee Swanson is a 64 y.o.  Female  present for CPE. All past medical history, surgical history, allergies, family history, immunizations, medications and social history were updated in the electronic medical record today. All recent labs, ED visits and hospitalizations within the last year were reviewed.  Health maintenance:  Colonoscopy: completed 2012 dr. Olevia Perches, pt reports 10 year followup Mammogram: completed: 11/2019 with GYN ([place]) Cervical cancer screening: last pap:1/ 2021 with GYN- 5 yr Immunizations: tdap 04/2018, Influenza (encouraged yearly), shingrix series completed. covid series completed  Infectious disease screening: HIV and Hep C completed DEXA: ordered [facility] Assistive device: none Oxygen XIP:JASN Patient has a Dental home. Hospitalizations/ED visits: reviewed   Depression screen [facility] 2/9 06/23/2020 06/07/2020 05/01/2018  Decreased Interest 0 0 0  Down, Depressed, Hopeless 0 0 0  PHQ - 2 Score 0 0 0   No flowsheet data found.   Immunization History  Administered Date(s) Administered  . Influenza Inj Mdck Quad With Preservative 08/07/2017  . Influenza Whole 08/13/2011  . PFIZER SARS-COV-2 Vaccination 01/09/2020, 02/08/2020  . Tdap  05/01/2018  . Zoster Recombinat (Shingrix) 05/01/2018, 08/12/2018    Past Medical History:  Diagnosis Date  . Chicken pox   . Gynecological examination    sees Dr. Ronita Hipps   . Obstructive sleep apnea    sees Dr. Gwenette Greet   . Shingles    at age 66    Allergies  Allergen Reactions  . Ibuprofen Swelling  . Penicillins Rash   Past Surgical History:  Procedure Laterality Date  . CESAREAN SECTION  1999  . COLONOSCOPY  06-28-11   per Dr. Olevia Perches, clear, repeat in 10 yrs   . LEFT OOPHORECTOMY  1996  . MYOMECTOMY    . ORIF [place]   left  . UTERINE FIBROID SURGERY  2009   Family History  Problem Relation Age of Onset  . Arthritis Other        fhx  . Hyperlipidemia Other        fhx  . Lung cancer Maternal Grandfather        lung/fhx  . Stroke Maternal Grandmother   . Stroke Paternal Grandmother   . Asthma Mother   . Skin cancer Mother   . Hearing loss Mother   . Hearing loss Father   . Parkinson's disease Father   . Hearing loss Brother   . Hypertension Brother    Social History   Social History Narrative   One child, married.    BA- Pharmacist, hospital.    Former smoker.    Uses seat belt, bike helmet, smoke alarm in the home   Uses herbal remedies.    Feels safe in her relationships.     Allergies as of 06/23/2020  Reactions   Ibuprofen Swelling   Penicillins Rash      Medication List       Accurate as of June 23, 2020  9:30 AM. If you have any questions, ask your nurse or doctor.        STOP taking these medications   hydrOXYzine 25 MG capsule Commonly known as: Vistaril Stopped by: Howard Pouch, DO     TAKE these medications   losartan 25 MG tablet Commonly known as: Cozaar Take 1 tablet (25 mg total) by mouth daily. Started by: Howard Pouch, DO       All past medical history, surgical history, allergies, family history, immunizations andmedications were updated in the EMR today and reviewed under the history and medication  portions of their EMR.     No results found for this or any previous visit (from the past 2160 hour(s)).  No results found.   ROS: 14 pt review of systems performed and negative (unless mentioned in an HPI)  Objective: BP (!) 147/87 (BP Location: Left Arm, Patient Position: Sitting, Cuff Size: Normal)   Pulse 86   Ht 5' 5"  (1.651 m)   Wt 203 lb 9.6 oz (92.4 kg)   SpO2 97%   BMI 33.88 kg/m  Gen: Afebrile. No acute distress. Nontoxic in appearance, well-developed, well-nourished,  Pleasant female.  HENT: AT. [place]. Bilateral TM visualized and normal in appearance, normal external auditory canal. MMM, no oral lesions, adequate dentition. Bilateral nares within normal limits. Throat without erythema, ulcerations or exudates. no Cough on exam, no hoarseness on exam. Eyes:Pupils Equal Round Reactive to light, Extraocular movements intact,  Conjunctiva without redness, discharge or icterus. Neck/lymp/endocrine: Supple,no lymphadenopathy, no thyromegaly CV: RRR no murmur, no edema, +2/4 P posterior tibialis pulses. No JVD. Chest: CTAB, no wheeze, rhonchi or crackles. normal Respiratory effort. good Air movement. Abd: Soft. flat. NTND. BS present. no Masses palpated. No hepatosplenomegaly. No rebound tenderness or guarding. Skin: no rashes, purpura or petechiae. Warm and well-perfused. Skin intact. Neuro/Msk:  Normal gait. PERLA. EOMi. Alert. Oriented x3.  Cranial nerves II through XII intact. Muscle strength 5/5 upper/lower extremity. DTRs equal bilaterally. Psych: Normal affect, dress and demeanor. Normal speech. Normal thought content and judgment.  No exam data present  Assessment/plan: Aimee Swanson is a 64 y.o. female present for Diabetes mellitus screening - Hemoglobin A1c Essential hypertension/HLD/obesity Pt has had elevated BP last 3 visits. Discussed BP meds today.  - start Losartan 25 mg QD.  - low sodium  - routine  exercise.  - CBC - Comp Met (CMET) - tsh - lipid  panel - f/u 3 mos, once stable q 6 mos.   Estrogen deficiency - DG Bone Density; Future Encounter for preventive health examination Patient was encouraged to exercise greater than 150 minutes a week. Patient was encouraged to choose a diet filled with fresh fruits and vegetables, and lean meats. AVS provided to patient today for education/recommendation on gender specific health and safety maintenance. Colonoscopy: completed 2012 dr. Olevia Perches, pt reports 10 year followup Mammogram: completed: 11/2019 with GYN ([place]) Cervical cancer screening: last pap:1/ 2021 with GYN- 5 yr Immunizations: tdap 04/2018, Influenza (encouraged yearly), shingrix series completed. covid series completed  Infectious disease screening: HIV and Hep C completed DEXA: ordered [facility]  Return in about 1 year (around 06/23/2021) for CPE (30 min) and 3 mos BP.   Orders Placed This Encounter  Procedures  . DG Bone Density  . CBC  . Comp Met (CMET)  . TSH  .  Hemoglobin A1c  . Lipid panel   Meds ordered this encounter  Medications  . losartan (COZAAR) 25 MG tablet    Sig: Take 1 tablet (25 mg total) by mouth daily.    Dispense:  90 tablet    Refill:  1   Referral Orders  No referral(s) requested today     Electronically signed by: Howard Pouch, [place]

## 2020-06-23 NOTE — Patient Instructions (Signed)
Health Maintenance, Female Adopting a healthy lifestyle and getting preventive care are important in promoting health and wellness. Ask your health care provider about:  The right schedule for you to have regular tests and exams.  Things you can do on your own to prevent diseases and keep yourself healthy. What should I know about diet, weight, and exercise? Eat a healthy diet   Eat a diet that includes plenty of vegetables, fruits, low-fat dairy products, and lean protein.  Do not eat a lot of foods that are high in solid fats, added sugars, or sodium. Maintain a healthy weight Body mass index (BMI) is used to identify weight problems. It estimates body fat based on height and weight. Your health care provider can help determine your BMI and help you achieve or maintain a healthy weight. Get regular exercise Get regular exercise. This is one of the most important things you can do for your health. Most adults should:  Exercise for at least 150 minutes each week. The exercise should increase your heart rate and make you sweat (moderate-intensity exercise).  Do strengthening exercises at least twice a week. This is in addition to the moderate-intensity exercise.  Spend less time sitting. Even light physical activity can be beneficial. Watch cholesterol and blood lipids Have your blood tested for lipids and cholesterol at 63 years of age, then have this test every 5 years. Have your cholesterol levels checked more often if:  Your lipid or cholesterol levels are high.  You are older than 63 years of age.  You are at high risk for heart disease. What should I know about cancer screening? Depending on your health history and family history, you may need to have cancer screening at various ages. This may include screening for:  Breast cancer.  Cervical cancer.  Colorectal cancer.  Skin cancer.  Lung cancer. What should I know about heart disease, diabetes, and high blood  pressure? Blood pressure and heart disease  High blood pressure causes heart disease and increases the risk of stroke. This is more likely to develop in people who have high blood pressure readings, are of African descent, or are overweight.  Have your blood pressure checked: ? Every 3-5 years if you are 18-39 years of age. ? Every year if you are 40 years old or older. Diabetes Have regular diabetes screenings. This checks your fasting blood sugar level. Have the screening done:  Once every three years after age 40 if you are at a normal weight and have a low risk for diabetes.  More often and at a younger age if you are overweight or have a high risk for diabetes. What should I know about preventing infection? Hepatitis B If you have a higher risk for hepatitis B, you should be screened for this virus. Talk with your health care provider to find out if you are at risk for hepatitis B infection. Hepatitis C Testing is recommended for:  Everyone born from 1945 through 1965.  Anyone with known risk factors for hepatitis C. Sexually transmitted infections (STIs)  Get screened for STIs, including gonorrhea and chlamydia, if: ? You are sexually active and are younger than 63 years of age. ? You are older than 63 years of age and your health care provider tells you that you are at risk for this type of infection. ? Your sexual activity has changed since you were last screened, and you are at increased risk for chlamydia or gonorrhea. Ask your health care provider if   you are at risk.  Ask your health care provider about whether you are at high risk for HIV. Your health care provider may recommend a prescription medicine to help prevent HIV infection. If you choose to take medicine to prevent HIV, you should first get tested for HIV. You should then be tested every 3 months for as long as you are taking the medicine. Pregnancy  If you are about to stop having your period (premenopausal) and  you may become pregnant, seek counseling before you get pregnant.  Take 400 to 800 micrograms (mcg) of folic acid every day if you become pregnant.  Ask for birth control (contraception) if you want to prevent pregnancy. Osteoporosis and menopause Osteoporosis is a disease in which the bones lose minerals and strength with aging. This can result in bone fractures. If you are 65 years old or older, or if you are at risk for osteoporosis and fractures, ask your health care provider if you should:  Be screened for bone loss.  Take a calcium or vitamin D supplement to lower your risk of fractures.  Be given hormone replacement therapy (HRT) to treat symptoms of menopause. Follow these instructions at home: Lifestyle  Do not use any products that contain nicotine or tobacco, such as cigarettes, e-cigarettes, and chewing tobacco. If you need help quitting, ask your health care provider.  Do not use street drugs.  Do not share needles.  Ask your health care provider for help if you need support or information about quitting drugs. Alcohol use  Do not drink alcohol if: ? Your health care provider tells you not to drink. ? You are pregnant, may be pregnant, or are planning to become pregnant.  If you drink alcohol: ? Limit how much you use to 0-1 drink a day. ? Limit intake if you are breastfeeding.  Be aware of how much alcohol is in your drink. In the [place] one drink equals one 12 oz bottle of beer (355 mL), one 5 oz glass of wine (148 mL), or one 1 oz glass of hard liquor (44 mL). General instructions  Schedule regular health, dental, and eye exams.  Stay current with your vaccines.  Tell your health care provider if: ? You often feel depressed. ? You have ever been abused or do not feel safe at home. Summary  Adopting a healthy lifestyle and getting preventive care are important in promoting health and wellness.  Follow your health care provider's instructions about healthy  diet, exercising, and getting tested or screened for diseases.  Follow your health care provider's instructions on monitoring your cholesterol and blood pressure. This information is not intended to replace advice given to you by your health care provider. Make sure you discuss any questions you have with your health care provider. Document Revised: 10/22/2018 Document Reviewed: 10/22/2018 Elsevier Patient Education  2020 Elsevier Inc.  

## 2020-06-24 ENCOUNTER — Telehealth: Payer: Self-pay | Admitting: Family Medicine

## 2020-06-24 DIAGNOSIS — E785 Hyperlipidemia, unspecified: Secondary | ICD-10-CM

## 2020-06-24 MED ORDER — ATORVASTATIN CALCIUM 20 MG PO TABS
20.0000 mg | ORAL_TABLET | Freq: Every day | ORAL | 3 refills | Status: DC
Start: 2020-06-24 — End: 2021-06-20

## 2020-06-24 NOTE — Telephone Encounter (Signed)
Please call patient Liver, kidney and thyroid function are normal Blood cell counts and electrolytes are normal Diabetes screening/A1c is still in the prediabetic range at 6.1 Cholesterol panel is above goal.  With a total cholesterol of 240, HDL 44.5, LDL 160 and  triglycerides 175.   -Goal:  LDL100 or less, Trig < 150 and A1c <5.7  I would recommend a mediterranean diet and regular exercise.  A mediterranean diet is high in fruits, vegetables, whole grains, fish, chicken, nuts, healthy fats (olive oil or canola oil). Low fat dairy. Limit butter, margarine, red meat and sweets. There are many online resources for Mediterranean diet.   - no medication is needed at this time for her prediabetes, but if increases more would recommend start of metformin.   - Her high cholesterol does put her at an elevated Risk for Heart attack and stroke. I would recommend start of behavior modifications mentioned above and start a cholesterol lowering medication which also provides added Cardiovascular protection. I have called lipitor in for her to her pharmacy.  F/U 3 mos w/ provider and we will recheck her cholesterol at that visit as well- would encourage her to fast for that appt.

## 2020-06-24 NOTE — Telephone Encounter (Signed)
Called pt and left voicemail requesting that the pt return phone call.

## 2020-06-28 NOTE — Telephone Encounter (Signed)
LM for pt to returncall

## 2020-06-28 NOTE — Telephone Encounter (Signed)
Waiting to start Lipitor is her decision. She is at a high risk for stroke and heart attack with those levels.  Lipitor helps reduce cholesterol, but it also provides some cardiovascular protection (stroke/heart attack).   Either way she decides,  we will retest levels fasting at next appt.

## 2020-06-28 NOTE — Telephone Encounter (Signed)
Patient returned call and advised of results. She would like to know if possible to hold off on starting Lipitor until next provider appt on 11/15. She has started the diet changes recommended.  Please advise if okay for this.

## 2020-06-29 NOTE — Telephone Encounter (Signed)
Patient returned call and advised of recommendations. °

## 2020-06-29 NOTE — Telephone Encounter (Signed)
LM for pt to returncall

## 2020-09-15 ENCOUNTER — Ambulatory Visit (INDEPENDENT_AMBULATORY_CARE_PROVIDER_SITE_OTHER): Payer: BC Managed Care – PPO | Admitting: Otolaryngology

## 2020-09-15 ENCOUNTER — Other Ambulatory Visit: Payer: Self-pay

## 2020-09-15 ENCOUNTER — Encounter (INDEPENDENT_AMBULATORY_CARE_PROVIDER_SITE_OTHER): Payer: Self-pay | Admitting: Otolaryngology

## 2020-09-15 VITALS — Temp 96.8°F

## 2020-09-15 DIAGNOSIS — H9313 Tinnitus, bilateral: Secondary | ICD-10-CM | POA: Diagnosis not present

## 2020-09-15 DIAGNOSIS — H903 Sensorineural hearing loss, bilateral: Secondary | ICD-10-CM

## 2020-09-15 NOTE — Progress Notes (Signed)
HPI: Aimee Swanson is a 64 y.o. female who presents for evaluation of ringing in her ears that she is noted for a couple of years.  She is also had a recent whiplash type injury to her neck and wanted a knot checked on the back of her neck. She worked for [employer] for a number years when she was younger and had annual hearing test at that time.  Past Medical History:  Diagnosis Date   Chicken pox    Gynecological examination    sees Dr. Ronita Hipps    Obstructive sleep apnea    sees Dr. Gwenette Greet    Shingles    at age 36    Past Surgical History:  Procedure Laterality Date   [place]   COLONOSCOPY  06-28-11   per Dr. Olevia Perches, clear, repeat in 10 yrs    [place]   left   [place]  2009   Social History   Socioeconomic History   Marital status: Married    Spouse name: Not on file   Number of children: 1   Years of education: Not on file   Highest education level: Not on file  Occupational History   Occupation: Pharmacist, hospital    Employer: [employer]  Tobacco Use   Smoking status: Former Smoker    Packs/day: 0.25    Years: 2.00    Pack years: 0.50    Types: Cigarettes   Smokeless tobacco: Never Used  Scientific laboratory technician Use: Never used  Substance and Sexual Activity   Alcohol use: Yes    Alcohol/week: 2.0 standard drinks    Types: 2 Standard drinks or equivalent per week   Drug use: No   Sexual activity: Not Currently  Other Topics Concern   Not on file  Social History Narrative   One child, married.    BA- Pharmacist, hospital.    Former smoker.    Uses seat belt, bike helmet, smoke alarm in the home   Uses herbal remedies.    Feels safe in her relationships.    Social Determinants of Health   Financial Resource Strain:    Difficulty of Paying Living Expenses: Not on file  Food Insecurity:    Worried About Charity fundraiser in the Last Year: Not on  file   [employer] of Food in the Last Year: Not on file  Transportation Needs:    Lack of Transportation (Medical): Not on file   Lack of Transportation (Non-Medical): Not on file  Physical Activity:    Days of Exercise per Week: Not on file   Minutes of Exercise per Session: Not on file  Stress:    Feeling of Stress : Not on file  Social Connections:    Frequency of Communication with Friends and Family: Not on file   Frequency of Social Gatherings with Friends and Family: Not on file   Attends Religious Services: Not on file   Active Member of Clubs or Organizations: Not on file   Attends Archivist Meetings: Not on file   Marital Status: Not on file   Family History  Problem Relation Age of Onset   Arthritis Other        fhx   Hyperlipidemia Other        fhx   Lung cancer Maternal Grandfather        lung/fhx  Stroke Maternal Grandmother    Stroke Paternal Grandmother    Asthma Mother    Skin cancer Mother    Hearing loss Mother    Hearing loss Father    Parkinson's disease Father    Hearing loss Brother    Hypertension Brother    Allergies  Allergen Reactions   Ibuprofen Swelling   Penicillins Rash   Prior to Admission medications   Medication Sig Start Date End Date Taking? Authorizing Provider  atorvastatin (LIPITOR) 20 MG tablet Take 1 tablet (20 mg total) by mouth daily. 06/24/20  Yes Kuneff, Renee A, DO  losartan (COZAAR) 25 MG tablet Take 1 tablet (25 mg total) by mouth daily. 06/23/20  Yes Kuneff, Renee A, DO     Positive ROS: Otherwise negative  All other systems have been reviewed and were otherwise negative with the exception of those mentioned in the HPI and as above.  Physical Exam: Constitutional: Alert, well-appearing, no acute distress Ears: External ears without lesions or tenderness. Ear canals are clear bilaterally with minimal cerumen buildup.  TMs were clear bilaterally. Nasal: External nose without lesions.  Septum midline. Clear nasal passages Oral: Lips and gums without lesions. Tongue and palate mucosa without lesions. Posterior oropharynx clear. Neck: No palpable adenopathy or masses.  The not she feels in the back of her neck represents muscle tightness but no palpable subcutaneous nodule or lymph node palpable. Respiratory: Breathing comfortably  Skin: No facial/neck lesions or rash noted.  Audiogram in the office today demonstrated bilateral downsloping sensorineural hearing loss in both ears slightly worse on the left side.  On the right side hearing was normal through 2000 Hz then progressed down to 60 dB at 6K.  The left ear hearing was normal through 1K and then progressed down to 50 dB at 2K and 70 dB at 6K.  She had type A tympanograms bilaterally.  SRT's were 10 dB on the right and 20 dB on the left.  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  Plan: Reviewed with patient that she would be a candidate for hearing aids if she is having difficulty with hearing. Concerning the tinnitus this is secondary to the high-frequency SNHL.  Recommended using ear protection when around loud noise. Discussed with her concerning using masking noise when the tinnitus is bad and also gave her samples of Lipo flavonoid to try to see if this helps at all with the tinnitus. She will follow-up as needed.  Radene Journey, MD

## 2020-09-23 ENCOUNTER — Encounter (INDEPENDENT_AMBULATORY_CARE_PROVIDER_SITE_OTHER): Payer: Self-pay

## 2020-09-26 ENCOUNTER — Ambulatory Visit: Payer: BC Managed Care – PPO | Admitting: Family Medicine

## 2020-09-27 ENCOUNTER — Encounter: Payer: Self-pay | Admitting: Family Medicine

## 2020-09-27 ENCOUNTER — Ambulatory Visit: Payer: BC Managed Care – PPO | Admitting: Family Medicine

## 2020-09-27 ENCOUNTER — Other Ambulatory Visit: Payer: Self-pay

## 2020-09-27 VITALS — BP 131/84 | HR 70 | Temp 98.4°F | Ht 65.0 in | Wt 191.0 lb

## 2020-09-27 DIAGNOSIS — E785 Hyperlipidemia, unspecified: Secondary | ICD-10-CM | POA: Diagnosis not present

## 2020-09-27 DIAGNOSIS — I10 Essential (primary) hypertension: Secondary | ICD-10-CM

## 2020-09-27 DIAGNOSIS — E669 Obesity, unspecified: Secondary | ICD-10-CM

## 2020-09-27 MED ORDER — LOSARTAN POTASSIUM 25 MG PO TABS
25.0000 mg | ORAL_TABLET | Freq: Every day | ORAL | 1 refills | Status: DC
Start: 2020-09-27 — End: 2021-03-02

## 2020-09-27 NOTE — Patient Instructions (Signed)

## 2020-09-27 NOTE — Progress Notes (Signed)
This visit occurred during the SARS-CoV-2 public health emergency.  Safety protocols were in place, including screening questions prior to the visit, additional usage of staff PPE, and extensive cleaning of exam room while observing appropriate contact time as indicated for disinfecting solutions.    Patient ID: BRICEYDA ABDULLAH, female  DOB: [birth year], 64 y.o.   MRN: [id number] Patient Care Team    Relationship Specialty Notifications Start End  Ma Hillock, DO PCP - General Family Medicine  05/01/18   Brien Few, MD Consulting Physician Obstetrics and Gynecology  05/05/18   Rutherford Guys, MD Consulting Physician Ophthalmology  05/05/18     Chief Complaint  Patient presents with  . Follow-up    [facility]    Subjective: TAREVA LESKE is a 64 y.o.  Female  present for  Hypertension/hyperlipidemia/obesity: Pt reports compliance with losartan. Blood pressures ranges at home normal. Patient denies chest pain, shortness of breath or lower extremity edema. Pt is  prescribed statin.  Depression screen [facility] 2/9 09/27/2020 06/23/2020 06/07/2020 05/01/2018  Decreased Interest 0 0 0 0  Down, Depressed, Hopeless 0 0 0 0  PHQ - 2 Score 0 0 0 0   No flowsheet data found.   Immunization History  Administered Date(s) Administered  . Influenza Inj Mdck Quad With Preservative 08/07/2017  . Influenza Whole 08/13/2011  . Influenza-Unspecified 09/12/2020  . PFIZER SARS-COV-2 Vaccination 01/09/2020, 02/08/2020  . Tdap 05/01/2018  . Zoster Recombinat (Shingrix) 05/01/2018, 08/12/2018    Past Medical History:  Diagnosis Date  . Chicken pox   . Gynecological examination    sees Dr. Ronita Hipps   . Obstructive sleep apnea    sees Dr. Gwenette Greet   . Shingles    at age 50    Allergies  Allergen Reactions  . Ibuprofen Swelling  . Penicillins Rash   Past Surgical History:  Procedure Laterality Date  . CESAREAN SECTION  1999  . COLONOSCOPY  06-28-11   per Dr. Olevia Perches, clear, repeat in 10 yrs    . LEFT OOPHORECTOMY  1996  . MYOMECTOMY    . ORIF [place]   left  . UTERINE FIBROID SURGERY  2009   Family History  Problem Relation Age of Onset  . Arthritis Other        fhx  . Hyperlipidemia Other        fhx  . Lung cancer Maternal Grandfather        lung/fhx  . Stroke Maternal Grandmother   . Stroke Paternal Grandmother   . Asthma Mother   . Skin cancer Mother   . Hearing loss Mother   . Hearing loss Father   . Parkinson's disease Father   . Hearing loss Brother   . Hypertension Brother    Social History   Social History Narrative   One child, married.    BA- Pharmacist, hospital.    Former smoker.    Uses seat belt, bike helmet, smoke alarm in the home   Uses herbal remedies.    Feels safe in her relationships.     Allergies as of 09/27/2020      Reactions   Ibuprofen Swelling   Penicillins Rash      Medication List       Accurate as of September 27, 2020  3:46 PM. If you have any questions, ask your nurse or doctor.        atorvastatin 20 MG tablet Commonly known as: LIPITOR Take 1 tablet (20 mg total)  by mouth daily.   losartan 25 MG tablet Commonly known as: Cozaar Take 1 tablet (25 mg total) by mouth daily.       All past medical history, surgical history, allergies, family history, immunizations andmedications were updated in the EMR today and reviewed under the history and medication portions of their EMR.     No results found for this or any previous visit (from the past 2160 hour(s)).  No results found.   ROS: 14 pt review of systems performed and negative (unless mentioned in an HPI)  Objective: BP 131/84   Pulse 70   Temp 98.4 F (36.9 C) (Oral)   Ht _0  (1.651 m)   Wt 191 lb (86.6 kg)   SpO2 98%   BMI 31.78 kg/m  Gen: Afebrile. No acute distress. Nontoxic. Pleasant female.  HENT: AT. [place] Eyes:Pupils Equal Round Reactive to light, Extraocular movements intact,  Conjunctiva without redness, discharge or  icterus. Neck/lymp/endocrine: Supple,no lymphadenopathy, no thyromegaly CV: RRR no murmur, no edema, +2/4 P posterior tibialis pulses Chest: CTAB, no wheeze or crackles Abd: Soft. NTND. BS present. no Masses palpated.  Skin: no rashes, purpura or petechiae.  Neuro:  Normal gait. PERLA. EOMi. Alert. Oriented x3  Psych: Normal affect, dress and demeanor. Normal speech. Normal thought content and judgment.   No exam data present  Assessment/plan: ABBAGALE GOGUEN is a 64 y.o. female present for Essential hypertension/HLD/obesity - stable - continue Losartan 25 mg QD.  - Continue Lipitor 20 - low sodium  - routine  exercise.  - Comp Met (CMET) - lipid panel - f/u 35.5 mos   Return in about 5 months (around 03/13/2021) for CMC (30 min).   Orders Placed This Encounter  Procedures  . Comp Met (CMET)  . Lipid panel   Meds ordered this encounter  Medications  . losartan (COZAAR) 25 MG tablet    Sig: Take 1 tablet (25 mg total) by mouth daily.    Dispense:  90 tablet    Refill:  1   Referral Orders  No referral(s) requested today     Electronically signed by: Howard Pouch, [place]

## 2020-09-28 LAB — COMPREHENSIVE METABOLIC PANEL
AG Ratio: 2 (calc) (ref 1.0–2.5)
ALT: 14 U/L (ref 6–29)
AST: 14 U/L (ref 10–35)
Albumin: 4.5 g/dL (ref 3.6–5.1)
Alkaline phosphatase (APISO): 100 U/L (ref 37–153)
BUN: 19 mg/dL (ref 7–25)
CO2: 23 mmol/L (ref 20–32)
Calcium: 9.8 mg/dL (ref 8.6–10.4)
Chloride: 103 mmol/L (ref 98–110)
Creat: 0.73 mg/dL (ref 0.50–0.99)
Globulin: 2.3 g/dL (calc) (ref 1.9–3.7)
Glucose, Bld: 84 mg/dL (ref 65–99)
Potassium: 4.3 mmol/L (ref 3.5–5.3)
Sodium: 138 mmol/L (ref 135–146)
Total Bilirubin: 0.8 mg/dL (ref 0.2–1.2)
Total Protein: 6.8 g/dL (ref 6.1–8.1)

## 2020-09-28 LAB — LIPID PANEL
Cholesterol: 110 mg/dL (ref <200)
HDL: 39 mg/dL — ABNORMAL LOW (ref >=50)
LDL Cholesterol (Calc): 52 mg/dL (calc)
Non-HDL Cholesterol (Calc): 71 mg/dL (calc) (ref <130)
Total CHOL/HDL Ratio: 2.8 (calc) (ref <5.0)
Triglycerides: 108 mg/dL (ref <150)

## 2020-10-10 ENCOUNTER — Ambulatory Visit
Admission: RE | Admit: 2020-10-10 | Discharge: 2020-10-10 | Disposition: A | Discharge status: Home / Self Care | Payer: BC Managed Care – PPO | Source: Ambulatory Visit | Attending: Family Medicine | Admitting: Family Medicine

## 2020-10-10 ENCOUNTER — Other Ambulatory Visit: Payer: Self-pay

## 2020-10-10 DIAGNOSIS — E2839 Other primary ovarian failure: Secondary | ICD-10-CM

## 2021-03-10 DIAGNOSIS — R87610 Atypical squamous cells of undetermined significance on cytologic smear of cervix (ASC-US): Secondary | ICD-10-CM

## 2021-03-10 DIAGNOSIS — N951 Menopausal and female climacteric states: Secondary | ICD-10-CM | POA: Insufficient documentation

## 2021-03-10 HISTORY — DX: Atypical squamous cells of undetermined significance on cytologic smear of cervix (ASC-US): R87.610

## 2021-03-14 ENCOUNTER — Other Ambulatory Visit: Payer: Self-pay

## 2021-03-14 ENCOUNTER — Ambulatory Visit (INDEPENDENT_AMBULATORY_CARE_PROVIDER_SITE_OTHER): Payer: BC Managed Care – PPO | Admitting: Family Medicine

## 2021-03-14 ENCOUNTER — Encounter: Payer: Self-pay | Admitting: Family Medicine

## 2021-03-14 VITALS — BP 113/74 | HR 68 | Temp 98.5°F | Ht 65.0 in | Wt 185.0 lb

## 2021-03-14 DIAGNOSIS — E669 Obesity, unspecified: Secondary | ICD-10-CM

## 2021-03-14 DIAGNOSIS — I10 Essential (primary) hypertension: Secondary | ICD-10-CM | POA: Diagnosis not present

## 2021-03-14 DIAGNOSIS — E785 Hyperlipidemia, unspecified: Secondary | ICD-10-CM

## 2021-03-14 MED ORDER — LOSARTAN POTASSIUM 25 MG PO TABS
25.0000 mg | ORAL_TABLET | Freq: Every day | ORAL | 1 refills | Status: DC
Start: 2021-03-14 — End: 2021-07-03

## 2021-03-14 NOTE — Patient Instructions (Signed)
Great to see you today.  I have refilled the medication(s) we provide.   If labs were collected, we will inform you of lab results once received either by echart message or telephone call.   - echart message- for normal results that have been seen by the patient already.   - telephone call: abnormal results or if patient has not viewed results in their echart.  

## 2021-03-14 NOTE — Progress Notes (Signed)
This visit occurred during the SARS-CoV-2 public health emergency.  Safety protocols were in place, including screening questions prior to the visit, additional usage of staff PPE, and extensive cleaning of exam room while observing appropriate contact time as indicated for disinfecting solutions.    Patient ID: Aimee Swanson, female  DOB: [birth year], 65 y.o.   MRN: [id number] Patient Care Team    Relationship Specialty Notifications Start End  Ma Hillock, DO PCP - General Family Medicine  05/01/18   Brien Few, MD Consulting Physician Obstetrics and Gynecology  05/05/18   Rutherford Guys, MD Consulting Physician Ophthalmology  05/05/18     Chief Complaint  Patient presents with  . Follow-up    CMC; pt is not fasting  . Hypertension    Subjective: Aimee Swanson is a 65 y.o.  Female  present for  Hypertension/hyperlipidemia/obesity: Pt reports compliance  with losartan 25 mg qd. Blood pressures ranges at home normal. Patient denies chest pain, shortness of breath, dizziness or lower extremity edema.  Pt is  prescribed statin.  Depression screen [facility] 2/9 09/27/2020 06/23/2020 06/07/2020 05/01/2018  Decreased Interest 0 0 0 0  Down, Depressed, Hopeless 0 0 0 0  PHQ - 2 Score 0 0 0 0   No flowsheet data found.   Immunization History  Administered Date(s) Administered  . Influenza Inj Mdck Quad With Preservative 08/07/2017  . Influenza Whole 08/13/2011  . Influenza-Unspecified 09/12/2020  . PFIZER(Purple Top)SARS-COV-2 Vaccination 01/09/2020, 02/08/2020, 09/29/2020  . Tdap 05/01/2018  . Zoster Recombinat (Shingrix) 05/01/2018, 08/12/2018    Past Medical History:  Diagnosis Date  . Chicken pox   . Gynecological examination    sees Dr. Ronita Hipps   . Obstructive sleep apnea    sees Dr. Gwenette Greet   . Shingles    at age 25    Allergies  Allergen Reactions  . Ibuprofen Swelling  . Penicillins Rash   Past Surgical History:  Procedure Laterality Date  . CESAREAN  SECTION  1999  . COLONOSCOPY  06-28-11   per Dr. Olevia Perches, clear, repeat in 10 yrs   . LEFT OOPHORECTOMY  1996  . MYOMECTOMY    . ORIF [place]   left  . UTERINE FIBROID SURGERY  2009   Family History  Problem Relation Age of Onset  . Arthritis Other        fhx  . Hyperlipidemia Other        fhx  . Lung cancer Maternal Grandfather        lung/fhx  . Stroke Maternal Grandmother   . Stroke Paternal Grandmother   . Asthma Mother   . Skin cancer Mother   . Hearing loss Mother   . Hearing loss Father   . Parkinson's disease Father   . Hearing loss Brother   . Hypertension Brother    Social History   Social History Narrative   One child, married.    BA- Pharmacist, hospital.    Former smoker.    Uses seat belt, bike helmet, smoke alarm in the home   Uses herbal remedies.    Feels safe in her relationships.     Allergies as of 03/14/2021      Reactions   Ibuprofen Swelling   Penicillins Rash      Medication List       Accurate as of Mar 14, 2021  3:45 PM. If you have any questions, ask your nurse or doctor.  atorvastatin 20 MG tablet Commonly known as: LIPITOR Take 1 tablet (20 mg total) by mouth daily.   losartan 25 MG tablet Commonly known as: Cozaar Take 1 tablet (25 mg total) by mouth daily.       All past medical history, surgical history, allergies, family history, immunizations andmedications were updated in the EMR today and reviewed under the history and medication portions of their EMR.     No results found for this or any previous visit (from the past 2160 hour(s)).  No results found.   ROS: 14 pt review of systems performed and negative (unless mentioned in an HPI)  Objective: BP 113/74   Pulse 68   Temp 98.5 F (36.9 C) (Oral)   Ht 5\' 5"  (1.651 m)   Wt 185 lb (83.9 kg)   SpO2 99%   BMI 30.79 kg/m  Gen: Afebrile. No acute distress.  HENT: AT. [place].  Eyes:Pupils Equal Round Reactive to light, Extraocular movements intact,   Conjunctiva without redness, discharge or icterus. Neck/lymp/endocrine: Supple,no lymphadenopathy, no thyromegaly CV: RRR no murmur Chest: CTAB, no wheeze or crackles Abd: Soft. NTND. BS present. no Masses palpated.  Neuro: Normal gait. PERLA. EOMi. Alert. Oriented x3 Psych: Normal affect, dress and demeanor. Normal speech. Normal thought content and judgment.   No exam data present  Assessment/plan: Aimee Swanson is a 65 y.o. female present for Essential hypertension/HLD/obesity - stable.  - continue Losartan 25 mg QD.  - continue Lipitor 20 - low sodium  - routine  exercise.  - next appt in 5.5 mos cpe with labs.   Return in about 6 months (around 09/04/2021) for CPE (30 min), CMC (30 min).   No orders of the defined types were placed in this encounter.  Meds ordered this encounter  Medications  . losartan (COZAAR) 25 MG tablet    Sig: Take 1 tablet (25 mg total) by mouth daily.    Dispense:  90 tablet    Refill:  1   Referral Orders  No referral(s) requested today     Electronically signed by: Howard Pouch, [place]

## 2021-03-15 ENCOUNTER — Telehealth: Payer: Self-pay

## 2021-03-15 NOTE — Telephone Encounter (Signed)
Received Mammogram results from [facility] on 03/15/21. Placed on PCP desk for review.

## 2021-05-08 ENCOUNTER — Encounter: Payer: Self-pay | Admitting: Family Medicine

## 2021-05-08 ENCOUNTER — Other Ambulatory Visit: Payer: Self-pay

## 2021-05-08 ENCOUNTER — Ambulatory Visit: Payer: BC Managed Care – PPO | Admitting: Family Medicine

## 2021-05-08 VITALS — BP 139/84 | HR 84 | Temp 98.1°F | Ht 65.0 in | Wt 187.0 lb

## 2021-05-08 DIAGNOSIS — M533 Sacrococcygeal disorders, not elsewhere classified: Secondary | ICD-10-CM

## 2021-05-08 DIAGNOSIS — S76011A Strain of muscle, fascia and tendon of right hip, initial encounter: Secondary | ICD-10-CM

## 2021-05-08 MED ORDER — METAXALONE 800 MG PO TABS: 800.0000 mg | ORAL_TABLET | Freq: Two times a day (BID) | ORAL | 1 refills | Status: DC

## 2021-05-08 MED ORDER — PREDNISONE 20 MG PO TABS: ORAL_TABLET | ORAL | 0 refills | Status: DC

## 2021-05-08 NOTE — Progress Notes (Signed)
This visit occurred during the SARS-CoV-2 public health emergency.  Safety protocols were in place, including screening questions prior to the visit, additional usage of staff PPE, and extensive cleaning of exam room while observing appropriate contact time as indicated for disinfecting solutions.    Aimee Swanson , [birth year], 65 y.o., female MRN: [id number] Patient Care Team    Relationship Specialty Notifications Start End  Ma Hillock, DO PCP - General Family Medicine  05/01/18   Brien Few, MD Consulting Physician Obstetrics and Gynecology  05/05/18   Rutherford Guys, MD Consulting Physician Ophthalmology  05/05/18     Chief Complaint  Patient presents with   Leg Pain    Pt c/o R leg pain x 8 weeks;      Subjective: Pt presents for an OV with complaints of right leg pain of approximately 8 weeks duration.  She points to the right anterior hip and medial thigh area as location of discomfort.  She reports she had been lifting some heavy objects, helping a family member move.  She does not require specific injury.  She reports pain is in the location of the hip flexor area, medial thigh and also points to the right SI joint.  She states when she first gets up in the morning its a little stiff but as the day goes on it feels better.  She has no prior history of injury or surgery to her right hip or lower back.  She has not tried anything for the discomfort.  She has a severe reaction to ibuprofen/NSAIDs.   Depression screen [facility] 2/9 09/27/2020 06/23/2020 06/07/2020 05/01/2018  Decreased Interest 0 0 0 0  Down, Depressed, Hopeless 0 0 0 0  PHQ - 2 Score 0 0 0 0    Allergies  Allergen Reactions   Ibuprofen Swelling   Penicillins Rash   Social History   Social History Narrative   One child, married.    BA- Pharmacist, hospital.    Former smoker.    Uses seat belt, bike helmet, smoke alarm in the home   Uses herbal remedies.    Feels safe in her relationships.    Past Medical  History:  Diagnosis Date   Chicken pox    Gynecological examination    sees Dr. Ronita Hipps    Obstructive sleep apnea    sees Dr. Gwenette Greet    Shingles    at age 45    Past Surgical History:  Procedure Laterality Date   [place]   COLONOSCOPY  06-28-11   per Dr. Olevia Perches, clear, repeat in 10 yrs    [place]   left   [place]  2009   Family History  Problem Relation Age of Onset   Arthritis Other        fhx   Hyperlipidemia Other        fhx   Lung cancer Maternal Grandfather        lung/fhx   Stroke Maternal Grandmother    Stroke Paternal Grandmother    Asthma Mother    Skin cancer Mother    Hearing loss Mother    Hearing loss Father    Parkinson's disease Father    Hearing loss Brother    Hypertension Brother    Allergies as of 05/08/2021       Reactions   Ibuprofen Swelling   Penicillins Rash  Medication List        Accurate as of May 08, 2021 11:59 PM. If you have any questions, ask your nurse or doctor.          atorvastatin 20 MG tablet Commonly known as: LIPITOR Take 1 tablet (20 mg total) by mouth daily.   losartan 25 MG tablet Commonly known as: Cozaar Take 1 tablet (25 mg total) by mouth daily.   metaxalone 800 MG tablet Commonly known as: Skelaxin Take 1 tablet (800 mg total) by mouth 2 (two) times daily. Started by: Howard Pouch, DO   predniSONE 20 MG tablet Commonly known as: DELTASONE 60 mg x3d, 40 mg x3d, 20 mg x2d, 10 mg x2d Started by: Howard Pouch, DO        All past medical history, surgical history, allergies, family history, immunizations andmedications were updated in the EMR today and reviewed under the history and medication portions of their EMR.     ROS: Negative, with the exception of above mentioned in HPI   Objective:  BP 139/84   Pulse 84   Temp 98.1 F (36.7 C) (Oral)   Ht 5\' 5"  (1.651 m)   Wt 187 lb (84.8  kg)   SpO2 99%   BMI 31.12 kg/m  Body mass index is 31.12 kg/m. Gen: Afebrile. No acute distress. Nontoxic in appearance, well developed, well nourished.  HENT: AT. [place].  Eyes:Pupils Equal Round Reactive to light, Extraocular movements intact,  Conjunctiva without redness, discharge or icterus. MSK: Lumbar spine without any bony tenderness.  No paraspinal muscle fullness or discomfort to palpation.  TTP right SI joint, hip flexor tendon insertion site.  Mild discomfort with hip flexion, hip extension.  Decreased range of motion bilateral internal rotation of hips-no pain.  Positive FABRE right for SI discomfort. Skin:No rashes, purpura or petechiae.  Neuro: Normal gait. PERLA. EOMi. Alert. Oriented x3  Psych: Normal affect, dress and demeanor. Normal speech. Normal thought content and judgment.  No results found. No results found. No results found for this or any previous visit (from the past 24 hour(s)).  Assessment/Plan: Aimee Swanson is a 65 y.o. female present for OV for  Strain of flexor muscle of right hip, initial encounter/SI (sacroiliac) pain Patient's exam is consistent with a hip flexor strain, as well as sacroiliac dysfunction. Encouraged her to avoid any heavy lifting for the next 4 to 6 weeks. Hip flexor stretches to start in the next couple days when she starts to see improvement after starting steroids. Prednisone taper prescribed. Skelaxin twice daily as needed prescribed. Patient advised hip flexor strains and sacroiliac dysfunction take up to 8 weeks to completely resolve.  Patient reported understanding. Emergent precautions discussed Follow-up in 8 weeks if symptoms or not resolved, sooner if worsening.  Reviewed expectations re: course of current medical issues. Discussed self-management of symptoms. Outlined signs and symptoms indicating need for more acute intervention. Patient verbalized understanding and all questions were answered. Patient received an  After-Visit Summary.    No orders of the defined types were placed in this encounter.  Meds ordered this encounter  Medications   metaxalone (SKELAXIN) 800 MG tablet    Sig: Take 1 tablet (800 mg total) by mouth 2 (two) times daily.    Dispense:  60 tablet    Refill:  1   predniSONE (DELTASONE) 20 MG tablet    Sig: 60 mg x3d, 40 mg x3d, 20 mg x2d, 10 mg x2d    Dispense:  18 tablet  Refill:  0   Referral Orders  No referral(s) requested today     Note is dictated utilizing voice recognition software. Although note has been proof read prior to signing, occasional typographical errors still can be missed. If any questions arise, please do not hesitate to call for verification.   electronically signed by:  Howard Pouch, DO  [place]

## 2021-05-08 NOTE — Patient Instructions (Addendum)
[email or web address]  Sacroiliac Joint Dysfunction  Sacroiliac joint dysfunction is a condition that causes inflammation on one or both sides of the sacroiliac (SI) joint. The SI joint is the joint between two bones of the pelvis called the sacrum and the ilium. The sacrum is the bone at the base of the spine. The ilium is the large bone that forms the hip. This condition causes deep aching or burning pain in the low back. In some cases,the pain may also spread into one or both buttocks, hips, or thighs. What are the causes? This condition may be caused by: Pregnancy. During pregnancy, extra stress is put on the SI joints because the pelvis widens. Injury, such as: Injuries from car crashes. Sports-related injuries. Work-related injuries. Having one leg that is shorter than the other. Conditions that affect the joints, such as: Rheumatoid arthritis. Gout. Psoriatic arthritis. Joint infection (septic arthritis). Sometimes, the cause of SI joint dysfunction is not known. What are the signs or symptoms? Symptoms of this condition include: Aching or burning pain in the lower back. The pain may also spread to other areas, such as: Buttocks. Groin. Thighs. Muscle spasms in or around the painful areas. Increased pain when standing, walking, running, stair climbing, bending, or lifting. How is this diagnosed? This condition is diagnosed with a physical exam and your medical history. During the exam, the health care provider may move one or both of your legs to different positions to check for pain. Various tests may be done to confirm the diagnosis, including: Imaging tests to look for other causes of pain. These may include: MRI. CT scan. Bone scan. Diagnostic injection. A numbing medicine is injected into the SI joint using a needle. If your pain is temporarily improved or stopped after the injection, this can indicate  that SI joint dysfunction is the problem. How is this treated? Treatment depends on the cause and severity of your condition. Treatment options can be noninvasive and may include: Ice or heat applied to the lower back area after an injury. This may help reduce pain and muscle spasms. Medicines to relieve pain or inflammation or to relax the muscles. Wearing a back brace (sacroiliac brace) to help support the joint while your back is healing. Physical therapy to increase muscle strength around the joint and flexibility at the joint. This may also involve learning proper body positions and ways of moving to relieve stress on the joint. Direct manipulation of the SI joint. Use of a device that provides electrical stimulation to help reduce pain at the joint. Other treatments may include: Injections of steroid medicine into the joint to reduce pain and swelling. Radiofrequency ablation. This treatment uses heat to burn away nerves that are carrying pain messages from the joint. Surgery to put in screws and plates that limit or prevent joint motion. This is rare. Follow these instructions at home: Medicines Take over-the-counter and prescription medicines only as told by your health care provider. Ask your health care provider if the medicine prescribed to you: Requires you to avoid driving or using machinery. Can cause constipation. You may need to take these actions to prevent or treat constipation: Drink enough fluid to keep your urine pale yellow. Take over-the-counter or prescription medicines. Eat foods that are high in fiber, such as beans, whole grains, and fresh fruits and vegetables. Limit foods that are high in fat and processed sugars, such as fried or sweet foods. If you have a brace: Wear the brace as told by your health care  provider. Remove it only as told by your health care provider. Keep the brace clean. If the brace is not waterproof: Do not let it get wet. Cover it with a  watertight covering when you take a bath or a shower. Managing pain, stiffness, and swelling     Icing can help with pain and swelling. Heat may help with muscle tension or spasms. Ask your health care provider if you should use ice or heat. If directed, put ice on the affected area: If you have a removable brace, remove it as told by your health care provider. Put ice in a plastic bag. Place a towel between your skin and the bag. Leave the ice on for 20 minutes, 2-3 times a day. Remove the ice if your skin turns bright red. This is very important. If you cannot feel pain, heat, or cold, you have a greater risk of damage to the area. If directed, apply heat to the affected area as often as told by your health care provider. Use the heat source that your health care provider recommends, such as a moist heat pack or a heating pad. Place a towel between your skin and the heat source. Leave the heat on for 20-30 minutes. Remove the heat if your skin turns bright red. This is especially important if you are unable to feel pain, heat, or cold. You may have a greater risk of getting burned. General instructions Rest as needed. Return to your normal activities as told by your health care provider. Ask your health care provider what activities are safe for you. Do exercises as told by your health care provider or physical therapist. Keep all follow-up visits. This is important. Contact a health care provider if: Your pain is not controlled with medicine. You have a fever. Your pain is getting worse. Get help right away if: You have weakness, numbness, or tingling in your legs or feet. You lose control of your bladder or bowels. Summary Sacroiliac (SI) joint dysfunction is a condition that causes inflammation on one or both sides of the SI joint. This condition causes deep aching or burning pain in the low back. In some cases, the pain may also spread into one or both buttocks, hips, or  thighs. Treatment depends on the cause and severity of your condition. It may include medicines to reduce pain and swelling or to relax muscles. This information is not intended to replace advice given to you by your health care provider. Make sure you discuss any questions you have with your healthcare provider. Document Revised: 03/10/2020 Document Reviewed: 03/10/2020 Elsevier Patient Education  [place].

## 2021-05-09 ENCOUNTER — Encounter: Payer: Self-pay | Admitting: Family Medicine

## 2021-05-09 ENCOUNTER — Encounter: Payer: Self-pay | Admitting: Gastroenterology

## 2021-05-12 DIAGNOSIS — U071 COVID-19: Secondary | ICD-10-CM

## 2021-05-12 HISTORY — DX: COVID-19: U07.1

## 2021-06-13 ENCOUNTER — Other Ambulatory Visit: Payer: Self-pay

## 2021-06-13 ENCOUNTER — Ambulatory Visit (AMBULATORY_SURGERY_CENTER): Payer: BC Managed Care – PPO | Admitting: *Deleted

## 2021-06-13 VITALS — Ht 65.0 in | Wt 183.0 lb

## 2021-06-13 DIAGNOSIS — Z1211 Encounter for screening for malignant neoplasm of colon: Secondary | ICD-10-CM

## 2021-06-13 MED ORDER — CLENPIQ 10-3.5-12 MG-GM -GM/160ML PO SOLN: 160.0000 mL | Freq: Once | ORAL | 0 refills | Status: AC

## 2021-06-13 NOTE — Progress Notes (Signed)
Virtual pre-visit completed. Instructions sent through MyChart and e-mail [email or web address]  No egg or soy allergy known to patient  No issues with past sedation with any surgeries or procedures Patient denies ever being told they had issues or difficulty with intubation  No FH of Malignant Hyperthermia No diet pills per patient No home 02 use per patient  No blood thinners per patient. Pt denies issues with constipation  No A fib or A flutter  EMMI video to pt or via [place] 19 guidelines implemented in [place] today with Pt and RN  Pt is fully vaccinated  for [employer] mailed to patient. Discussed with pt there will be an out-of-pocket cost for prep and that varies from $0 to 70 dollars   Due to the COVID-19 pandemic we are asking patients to follow certain guidelines.  Pt aware of COVID protocols and [facility] guidelines

## 2021-06-26 ENCOUNTER — Encounter: Payer: BC Managed Care – PPO | Admitting: Family Medicine

## 2021-06-28 ENCOUNTER — Other Ambulatory Visit: Payer: Self-pay

## 2021-06-28 ENCOUNTER — Encounter: Payer: Self-pay | Admitting: Gastroenterology

## 2021-06-28 ENCOUNTER — Ambulatory Visit (AMBULATORY_SURGERY_CENTER): Payer: BC Managed Care – PPO | Admitting: Gastroenterology

## 2021-06-28 VITALS — BP 128/69 | HR 73 | Temp 97.3°F | Resp 19 | Ht 65.0 in | Wt 183.0 lb

## 2021-06-28 DIAGNOSIS — K640 First degree hemorrhoids: Secondary | ICD-10-CM

## 2021-06-28 DIAGNOSIS — D124 Benign neoplasm of descending colon: Secondary | ICD-10-CM

## 2021-06-28 DIAGNOSIS — D123 Benign neoplasm of transverse colon: Secondary | ICD-10-CM

## 2021-06-28 DIAGNOSIS — Z86010 Personal history of colonic polyps: Secondary | ICD-10-CM

## 2021-06-28 DIAGNOSIS — Z1211 Encounter for screening for malignant neoplasm of colon: Secondary | ICD-10-CM

## 2021-06-28 DIAGNOSIS — D125 Benign neoplasm of sigmoid colon: Secondary | ICD-10-CM

## 2021-06-28 MED ORDER — SODIUM CHLORIDE 0.9 % IV SOLN: 500.0000 mL | Freq: Once | INTRAVENOUS | Status: DC

## 2021-06-28 NOTE — Op Note (Signed)
[facility] Patient Name: Aimee Swanson Procedure Date: 06/28/2021 12:03 PM MRN: [id number] Endoscopist: Gerrit Heck , MD Age: 65 Referring MD:  Date of Birth: Jul 31, 1956 Gender: Female Account #: [id number] Procedure:                Colonoscopy Indications:              Screening for colorectal malignant neoplasm. Last                            colonoscopy was 10 years ago (06/2011) and notable                            for diminutive benign polyp. Otherwise no active GI                            symptoms. Medicines:                Monitored Anesthesia Care Procedure:                Pre-Anesthesia Assessment:                           - Prior to the procedure, a History and Physical                            was performed, and patient medications and                            allergies were reviewed. The patient's tolerance of                            previous anesthesia was also reviewed. The risks                            and benefits of the procedure and the sedation                            options and risks were discussed with the patient.                            All questions were answered, and informed consent                            was obtained. Prior Anticoagulants: The patient has                            taken no previous anticoagulant or antiplatelet                            agents. ASA Grade Assessment: II - A patient with                            mild systemic disease. After reviewing the risks  and benefits, the patient was deemed in                            satisfactory condition to undergo the procedure.                           After obtaining informed consent, the colonoscope                            was passed under direct vision. Throughout the                            procedure, the patient's blood pressure, pulse, and                            oxygen saturations were monitored  continuously. The                            CF [id number] was introduced through the anus                            and advanced to the the terminal ileum. The                            colonoscopy was performed without difficulty. The                            patient tolerated the procedure well. The quality                            of the bowel preparation was good. The terminal                            ileum, ileocecal valve, appendiceal orifice, and                            rectum were photographed. Scope In: 12:07:14 PM Scope Out: 12:25:59 PM Scope Withdrawal Time: 0 hours 14 minutes 17 seconds  Total Procedure Duration: 0 hours 18 minutes 45 seconds  Findings:                 The perianal and digital rectal examinations were                            normal.                           Three sessile polyps were found in the sigmoid                            colon, descending colon and transverse colon. The                            polyps were 2 to 4 mm in size. These polyps were  removed with a cold biopsy forceps. Resection and                            retrieval were complete. Estimated blood loss was                            minimal.                           Non-bleeding internal hemorrhoids were found during                            retroflexion. The hemorrhoids were small and Grade                            I (internal hemorrhoids that do not prolapse).                           The terminal ileum appeared normal. Complications:            No immediate complications. Estimated Blood Loss:     Estimated blood loss was minimal. Impression:               - Three 2 to 4 mm polyps in the sigmoid colon, in                            the descending colon and in the transverse colon,                            removed with a cold biopsy forceps. Resected and                            retrieved.                           -  Non-bleeding internal hemorrhoids.                           - The examined portion of the ileum was normal. Recommendation:           - Patient has a contact number available for                            emergencies. The signs and symptoms of potential                            delayed complications were discussed with the                            patient. Return to normal activities tomorrow.                            Written discharge instructions were provided to the                            patient.                           -  Resume previous diet.                           - Continue present medications.                           - Await pathology results.                           - Repeat colonoscopy for surveillance based on                            pathology results.                           - Return to GI clinic PRN. Gerrit Heck, MD 06/28/2021 12:31:12 PM

## 2021-06-28 NOTE — Progress Notes (Signed)
Called to room to assist during endoscopic procedure.  Patient ID and intended procedure confirmed with present staff. Received instructions for my participation in the procedure from the performing physician.  

## 2021-06-28 NOTE — Progress Notes (Signed)
Pt's states no medical or surgical changes since previsit or office visit.   VS taken by CW 

## 2021-06-28 NOTE — Progress Notes (Signed)
Report to PACU, RN, vss, BBS= Clear.  

## 2021-06-28 NOTE — Patient Instructions (Addendum)
Handouts on hemorrhoids and polyps provided to you today  Await pathology results    YOU HAD AN ENDOSCOPIC PROCEDURE TODAY AT [facility]:   Refer to the procedure report that was given to you for any specific questions about what was found during the examination.  If the procedure report does not answer your questions, please call your gastroenterologist to clarify.  If you requested that your care partner not be given the details of your procedure findings, then the procedure report has been included in a sealed envelope for you to review at your convenience later.  YOU SHOULD EXPECT: Some feelings of bloating in the abdomen. Passage of more gas than usual.  Walking can help get rid of the air that was put into your GI tract during the procedure and reduce the bloating. If you had a lower endoscopy (such as a colonoscopy or flexible sigmoidoscopy) you may notice spotting of blood in your stool or on the toilet paper. If you underwent a bowel prep for your procedure, you may not have a normal bowel movement for a few days.  Please Note:  You might notice some irritation and congestion in your nose or some drainage.  This is from the oxygen used during your procedure.  There is no need for concern and it should clear up in a day or so.  SYMPTOMS TO REPORT IMMEDIATELY:  Following lower endoscopy (colonoscopy or flexible sigmoidoscopy):  Excessive amounts of blood in the stool  Significant tenderness or worsening of abdominal pains  Swelling of the abdomen that is new, acute  Fever of 100F or higher  For urgent or emergent issues, a gastroenterologist can be reached at any hour by calling [phone]. Do not use MyChart messaging for urgent concerns.    DIET:  We do recommend a small meal at first, but then you may proceed to your regular diet.  Drink plenty of fluids but you should avoid alcoholic beverages for 24 hours.  ACTIVITY:  You should plan to take it easy for the  rest of today and you should NOT DRIVE or use heavy machinery until tomorrow (because of the sedation medicines used during the test).    FOLLOW UP: Our staff will call the number listed on your records 48-72 hours following your procedure to check on you and address any questions or concerns that you may have regarding the information given to you following your procedure. If we do not reach you, we will leave a message.  We will attempt to reach you two times.  During this call, we will ask if you have developed any symptoms of COVID 19. If you develop any symptoms (ie: fever, flu-like symptoms, shortness of breath, cough etc.) before then, please call [phone].  If you test positive for Covid 19 in the 2 weeks post procedure, please call and report this information to [place].    If any biopsies were taken you will be contacted by phone or by letter within the next 1-3 weeks.  Please call us at [phone] if you have not heard about the biopsies in 3 weeks.    SIGNATURES/CONFIDENTIALITY: You and/or your care partner have signed paperwork which will be entered into your electronic medical record.  These signatures attest to the fact that that the information above on your After Visit Summary has been reviewed and is understood.  Full responsibility of the confidentiality of this discharge information lies with you and/or your care-partner.

## 2021-06-28 NOTE — Progress Notes (Signed)
GASTROENTEROLOGY PROCEDURE H&P NOTE   Primary Care Physician: Ma Hillock, DO    Reason for Procedure:  Colon cancer screening  Plan:    Colonoscopy  Patient is appropriate for endoscopic procedure(s) in the ambulatory ([place]) setting.  The nature of the procedure, as well as the risks, benefits, and alternatives were carefully and thoroughly reviewed with the patient. Ample time for discussion and questions allowed. The patient understood, was satisfied, and agreed to proceed.     HPI: Aimee Swanson is a 65 y.o. female who presents for ongoing polyp surveillance.  Last colonoscopy 06/2011 and notable for 3 mm benign polyp in the rectosigmoid colon and a lipoma.  Otherwise normal colonoscopy with recommendation repeat in 10 years.  Otherwise no active lower GI symptoms.  Past Medical History:  Diagnosis Date   Chicken pox    Gynecological examination    sees Dr. Ronita Hipps    Hyperlipidemia    Hypertension    Obstructive sleep apnea    sees Dr. Gwenette Greet    Shingles    at age 22    Sleep apnea     Past Surgical History:  Procedure Laterality Date   [place]   COLONOSCOPY  06-28-11   per Dr. Olevia Perches, clear, repeat in 10 yrs    [place]   left   [place]  2009    Prior to Admission medications   Medication Sig Start Date End Date Taking? Authorizing Provider  atorvastatin (LIPITOR) 20 MG tablet Take 1 tablet (20 mg total) by mouth daily. 06/24/20  Yes Kuneff, Renee A, DO  losartan (COZAAR) 25 MG tablet Take 1 tablet (25 mg total) by mouth daily. 03/14/21  Yes Kuneff, Renee A, DO  metaxalone (SKELAXIN) 800 MG tablet Take 1 tablet (800 mg total) by mouth 2 (two) times daily. Patient not taking: No sig reported 05/08/21   Howard Pouch A, DO    Current Outpatient Medications  Medication Sig Dispense Refill   atorvastatin (LIPITOR) 20 MG tablet Take 1 tablet (20 mg total) by  mouth daily. 90 tablet 3   losartan (COZAAR) 25 MG tablet Take 1 tablet (25 mg total) by mouth daily. 90 tablet 1   metaxalone (SKELAXIN) 800 MG tablet Take 1 tablet (800 mg total) by mouth 2 (two) times daily. (Patient not taking: No sig reported) 60 tablet 1   Current Facility-Administered Medications  Medication Dose Route Frequency Provider Last Rate Last Admin   0.9 %  sodium chloride infusion  500 mL Intravenous Once Tracey Stewart V, DO        Allergies as of 06/28/2021 - Review Complete 06/28/2021  Allergen Reaction Noted   Ibuprofen Swelling 07/05/2008   Penicillins Rash 07/05/2008    Family History  Problem Relation Age of Onset   Asthma Mother    Skin cancer Mother    Hearing loss Mother    Hearing loss Father    Parkinson's disease Father    Hearing loss Brother    Hypertension Brother    Stroke Maternal Grandmother    Lung cancer Maternal Grandfather        lung/fhx   Stroke Paternal Grandmother    Arthritis Other        fhx   Hyperlipidemia Other        fhx   Colon polyps Neg Hx    Colon cancer Neg Hx  Esophageal cancer Neg Hx    Rectal cancer Neg Hx    Stomach cancer Neg Hx     Social History   Socioeconomic History   Marital status: Married    Spouse name: Not on file   Number of children: 1   Years of education: Not on file   Highest education level: Not on file  Occupational History   Occupation: Pharmacist, hospital    Employer: [employer]  Tobacco Use   Smoking status: Former    Packs/day: 0.25    Years: 2.00    Pack years: 0.50    Types: Cigarettes   Smokeless tobacco: Never  Vaping Use   Vaping Use: Never used  Substance and Sexual Activity   Alcohol use: Yes    Alcohol/week: 2.0 standard drinks    Types: 2 Standard drinks or equivalent per week   Drug use: No   Sexual activity: Not Currently  Other Topics Concern   Not on file  Social History Narrative   One child, married.    BA- Pharmacist, hospital.    Former smoker.    Uses seat  belt, bike helmet, smoke alarm in the home   Uses herbal remedies.    Feels safe in her relationships.    Social Determinants of Health   Financial Resource Strain: Not on file  Food Insecurity: Not on file  Transportation Needs: Not on file  Physical Activity: Not on file  Stress: Not on file  Social Connections: Not on file  Intimate Partner Violence: Not on file    Physical Exam: Vital signs in last 24 hours: '@BP'$  136/87   Pulse 69   Temp (!) 97.3 F (36.3 C)   Ht '5\' 5"'$  (1.651 m)   Wt 183 lb (83 kg)   SpO2 98%   BMI 30.45 kg/m  GEN: NAD EYE: Sclerae anicteric ENT: MMM CV: Non-tachycardic Pulm: CTA b/l GI: Soft, NT/ND NEURO:  Alert & Oriented x 3   Gerrit Heck, DO, [facility]   06/28/2021 11:59 AM

## 2021-06-30 ENCOUNTER — Other Ambulatory Visit: Payer: Self-pay

## 2021-06-30 ENCOUNTER — Telehealth: Payer: Self-pay | Admitting: *Deleted

## 2021-06-30 NOTE — Telephone Encounter (Signed)
  Follow up Call-  Call back number 06/28/2021  Post procedure Call Back phone  # [phone]  Permission to leave phone message Yes  Some recent data might be hidden     Patient questions:  Do you have a fever, pain , or abdominal swelling? No. Pain Score  0 *  Have you tolerated food without any problems? Yes.    Have you been able to return to your normal activities? Yes.    Do you have any questions about your discharge instructions: Diet   No. Medications  No. Follow up visit  No.  Do you have questions or concerns about your Care? No.  Actions: * If pain score is 4 or above: No action needed, pain <4.  Have you developed a fever since your procedure? no  2.   Have you had an respiratory symptoms (SOB or cough) since your procedure? no  3.   Have you tested positive for COVID 19 since your procedure no  4.   Have you had any family members/close contacts diagnosed with the COVID 19 since your procedure?  no   If yes to any of these questions please route to Joylene John, RN and Joella Prince, RN

## 2021-07-03 ENCOUNTER — Other Ambulatory Visit: Payer: Self-pay

## 2021-07-03 ENCOUNTER — Encounter: Payer: Self-pay | Admitting: Family Medicine

## 2021-07-03 ENCOUNTER — Ambulatory Visit (INDEPENDENT_AMBULATORY_CARE_PROVIDER_SITE_OTHER): Payer: BC Managed Care – PPO | Admitting: Family Medicine

## 2021-07-03 ENCOUNTER — Encounter: Payer: Self-pay | Admitting: Gastroenterology

## 2021-07-03 VITALS — BP 132/79 | HR 77 | Temp 98.0°F | Ht 64.0 in | Wt 186.0 lb

## 2021-07-03 DIAGNOSIS — E785 Hyperlipidemia, unspecified: Secondary | ICD-10-CM

## 2021-07-03 DIAGNOSIS — Z131 Encounter for screening for diabetes mellitus: Secondary | ICD-10-CM | POA: Diagnosis not present

## 2021-07-03 DIAGNOSIS — I10 Essential (primary) hypertension: Secondary | ICD-10-CM | POA: Diagnosis not present

## 2021-07-03 DIAGNOSIS — Z6831 Body mass index (BMI) 31.0-31.9, adult: Secondary | ICD-10-CM | POA: Insufficient documentation

## 2021-07-03 DIAGNOSIS — Z0001 Encounter for general adult medical examination with abnormal findings: Secondary | ICD-10-CM

## 2021-07-03 DIAGNOSIS — G4733 Obstructive sleep apnea (adult) (pediatric): Secondary | ICD-10-CM

## 2021-07-03 MED ORDER — ATORVASTATIN CALCIUM 20 MG PO TABS
20.0000 mg | ORAL_TABLET | Freq: Every day | ORAL | 3 refills | Status: DC
Start: 2021-07-03 — End: 2021-12-20

## 2021-07-03 MED ORDER — LOSARTAN POTASSIUM 25 MG PO TABS: 25.0000 mg | ORAL_TABLET | Freq: Every day | ORAL | 1 refills | Status: DC

## 2021-07-03 NOTE — Patient Instructions (Signed)
Health Maintenance, Female Adopting a healthy lifestyle and getting preventive care are important in promoting health and wellness. Ask your health care provider about: The right schedule for you to have regular tests and exams. Things you can do on your own to prevent diseases and keep yourself healthy. What should I know about diet, weight, and exercise? Eat a healthy diet  Eat a diet that includes plenty of vegetables, fruits, low-fat dairy products, and lean protein. Do not eat a lot of foods that are high in solid fats, added sugars, or sodium.  Maintain a healthy weight Body mass index (BMI) is used to identify weight problems. It estimates body fat based on height and weight. Your health care provider can help determineyour BMI and help you achieve or maintain a healthy weight. Get regular exercise Get regular exercise. This is one of the most important things you can do for your health. Most adults should: Exercise for at least 150 minutes each week. The exercise should increase your heart rate and make you sweat (moderate-intensity exercise). Do strengthening exercises at least twice a week. This is in addition to the moderate-intensity exercise. Spend less time sitting. Even light physical activity can be beneficial. Watch cholesterol and blood lipids Have your blood tested for lipids and cholesterol at 64 years of age, then havethis test every 5 years. Have your cholesterol levels checked more often if: Your lipid or cholesterol levels are high. You are older than 64 years of age. You are at high risk for heart disease. What should I know about cancer screening? Depending on your health history and family history, you may need to have cancer screening at various ages. This may include screening for: Breast cancer. Cervical cancer. Colorectal cancer. Skin cancer. Lung cancer. What should I know about heart disease, diabetes, and high blood pressure? Blood pressure and heart  disease High blood pressure causes heart disease and increases the risk of stroke. This is more likely to develop in people who have high blood pressure readings, are of African descent, or are overweight. Have your blood pressure checked: Every 3-5 years if you are 18-39 years of age. Every year if you are 40 years old or older. Diabetes Have regular diabetes screenings. This checks your fasting blood sugar level. Have the screening done: Once every three years after age 40 if you are at a normal weight and have a low risk for diabetes. More often and at a younger age if you are overweight or have a high risk for diabetes. What should I know about preventing infection? Hepatitis B If you have a higher risk for hepatitis B, you should be screened for this virus. Talk with your health care provider to find out if you are at risk forhepatitis B infection. Hepatitis C Testing is recommended for: Everyone born from 1945 through 1965. Anyone with known risk factors for hepatitis C. Sexually transmitted infections (STIs) Get screened for STIs, including gonorrhea and chlamydia, if: You are sexually active and are younger than 64 years of age. You are older than 64 years of age and your health care provider tells you that you are at risk for this type of infection. Your sexual activity has changed since you were last screened, and you are at increased risk for chlamydia or gonorrhea. Ask your health care provider if you are at risk. Ask your health care provider about whether you are at high risk for HIV. Your health care provider may recommend a prescription medicine to help   prevent HIV infection. If you choose to take medicine to prevent HIV, you should first get tested for HIV. You should then be tested every 3 months for as long as you are taking the medicine. Pregnancy If you are about to stop having your period (premenopausal) and you may become pregnant, seek counseling before you get  pregnant. Take 400 to 800 micrograms (mcg) of folic acid every day if you become pregnant. Ask for birth control (contraception) if you want to prevent pregnancy. Osteoporosis and menopause Osteoporosis is a disease in which the bones lose minerals and strength with aging. This can result in bone fractures. If you are 65 years old or older, or if you are at risk for osteoporosis and fractures, ask your health care provider if you should: Be screened for bone loss. Take a calcium or vitamin D supplement to lower your risk of fractures. Be given hormone replacement therapy (HRT) to treat symptoms of menopause. Follow these instructions at home: Lifestyle Do not use any products that contain nicotine or tobacco, such as cigarettes, e-cigarettes, and chewing tobacco. If you need help quitting, ask your health care provider. Do not use street drugs. Do not share needles. Ask your health care provider for help if you need support or information about quitting drugs. Alcohol use Do not drink alcohol if: Your health care provider tells you not to drink. You are pregnant, may be pregnant, or are planning to become pregnant. If you drink alcohol: Limit how much you use to 0-1 drink a day. Limit intake if you are breastfeeding. Be aware of how much alcohol is in your drink. In the [place] one drink equals one 12 oz bottle of beer (355 mL), one 5 oz glass of wine (148 mL), or one 1 oz glass of hard liquor (44 mL). General instructions Schedule regular health, dental, and eye exams. Stay current with your vaccines. Tell your health care provider if: You often feel depressed. You have ever been abused or do not feel safe at home. Summary Adopting a healthy lifestyle and getting preventive care are important in promoting health and wellness. Follow your health care provider's instructions about healthy diet, exercising, and getting tested or screened for diseases. Follow your health care provider's  instructions on monitoring your cholesterol and blood pressure. This information is not intended to replace advice given to you by your health care provider. Make sure you discuss any questions you have with your healthcare provider. Document Revised: 10/22/2018 Document Reviewed: 10/22/2018 Elsevier Patient Education  2022 Elsevier Inc.  

## 2021-07-03 NOTE — Progress Notes (Signed)
This visit occurred during the SARS-CoV-2 public health emergency.  Safety protocols were in place, including screening questions prior to the visit, additional usage of staff PPE, and extensive cleaning of exam room while observing appropriate contact time as indicated for disinfecting solutions.    Patient ID: Aimee Swanson, female  DOB: 03-03-1956, 65 y.o.   MRN: [id number] Patient Care Team    Relationship Specialty Notifications Start End  Ma Hillock, DO PCP - General Family Medicine  05/01/18   Brien Few, MD Consulting Physician Obstetrics and Gynecology  05/05/18   Rutherford Guys, MD Consulting Physician Ophthalmology  05/05/18     Chief Complaint  Patient presents with   Annual Exam    Pt is fasting    Subjective: Aimee Swanson is a 65 y.o.  Female  present for CPE. All past medical history, surgical history, allergies, family history, immunizations, medications and social history were updated in the electronic medical record today. All recent labs, ED visits and hospitalizations within the last year were reviewed.  Health maintenance:  Colonoscopy: completed 2022,  - one tubular adenoma other hyperplastic. Dr C- waiting on recs.  Mammogram: completed: 03/2021 with GYN ([place]) Cervical cancer screening: last pap:1/ 2021 with GYN- 5 yr Immunizations: tdap 04/2018, Influenza (encouraged yearly), shingrix series completed. covid series completed. Infectious disease screening: HIV and Hep C completed DEXA: ordered [facility]-normal Assistive device: none Oxygen [id number] Patient has a Dental home. Hospitalizations/ED visits: reviewed  Hypertension/hyperlipidemia/obesity: Pt reports compliance  with losartan 25 mg qd. Blood pressures ranges at home normal. Patient denies chest pain, shortness of breath, dizziness or lower extremity edema.  Pt is  prescribed statin.  Depression screen [facility] 2/9 07/03/2021 09/27/2020 06/23/2020 06/07/2020 05/01/2018  Decreased Interest  0 0 0 0 0  Down, Depressed, Hopeless 0 0 0 0 0  PHQ - 2 Score 0 0 0 0 0   No flowsheet data found.    Immunization History  Administered Date(s) Administered   Influenza Inj Mdck Quad With Preservative 08/07/2017   Influenza Whole 08/13/2011   Influenza-Unspecified 09/12/2020   PFIZER(Purple Top)SARS-COV-2 Vaccination 01/09/2020, 02/08/2020, 09/29/2020   Tdap 05/01/2018   Zoster Recombinat (Shingrix) 05/01/2018, 08/12/2018     Past Medical History:  Diagnosis Date   Cervical strain 06/07/2020   Chicken pox    COVID-19 05/2021   Gynecological examination    sees Dr. Ronita Hipps    Hyperlipidemia    Hypertension    Obstructive sleep apnea    sees Dr. Gwenette Greet    Shingles    at age 66    Sleep apnea    Allergies  Allergen Reactions   Ibuprofen Swelling   Penicillins Rash   Past Surgical History:  Procedure Laterality Date   CESAREAN SECTION  1999   COLONOSCOPY  06-28-11   per Dr. Olevia Perches, clear, repeat in 10 yrs    [place]   left   [place]  2009   Family History  Problem Relation Age of Onset   Asthma Mother    Skin cancer Mother    Hearing loss Mother    Hearing loss Father    Parkinson's disease Father    Hearing loss Brother    Hypertension Brother    Stroke Maternal Grandmother    Lung cancer Maternal Grandfather        lung/fhx   Stroke Paternal Grandmother    Arthritis  Other        fhx   Hyperlipidemia Other        fhx   Colon polyps Neg Hx    Colon cancer Neg Hx    Esophageal cancer Neg Hx    Rectal cancer Neg Hx    Stomach cancer Neg Hx    Social History   Social History Narrative   One child, married.    BA- Pharmacist, hospital.    Former smoker.    Uses seat belt, bike helmet, smoke alarm in the home   Uses herbal remedies.    Feels safe in her relationships.     Allergies as of 07/03/2021       Reactions   Ibuprofen Swelling   Penicillins Rash         Medication List        Accurate as of July 03, 2021  3:38 PM. If you have any questions, ask your nurse or doctor.          STOP taking these medications    metaxalone 800 MG tablet Commonly known as: Skelaxin Stopped by: Howard Pouch, DO       TAKE these medications    atorvastatin 20 MG tablet Commonly known as: LIPITOR Take 1 tablet (20 mg total) by mouth daily.   losartan 25 MG tablet Commonly known as: Cozaar Take 1 tablet (25 mg total) by mouth daily.        All past medical history, surgical history, allergies, family history, immunizations andmedications were updated in the EMR today and reviewed under the history and medication portions of their EMR.     No results found for this or any previous visit (from the past 2160 hour(s)).  ROS: 14 pt review of systems performed and negative (unless mentioned in an HPI)  Objective: BP 132/79   Pulse 77   Temp 98 F (36.7 C) (Oral)   Ht '5\' 4"'$  (1.626 m)   Wt 186 lb (84.4 kg)   SpO2 99%   BMI 31.93 kg/m  Gen: Afebrile. No acute distress. Nontoxic in appearance, well-developed, well-nourished,  pleasant female obese.  HENT: AT. [place]. Bilateral TM visualized and normal in appearance, normal external auditory canal. MMM, no oral lesions, adequate dentition. Bilateral nares within normal limits. Throat without erythema, ulcerations or exudates. no Cough on exam, no hoarseness on exam. Eyes:Pupils Equal Round Reactive to light, Extraocular movements intact,  Conjunctiva without redness, discharge or icterus. Neck/lymp/endocrine: Supple,no lymphadenopathy, no thyromegaly CV: RRR no murmur, no edema, +2/4 P posterior tibialis pulses.  Chest: CTAB, no wheeze, rhonchi or crackles. normal Respiratory effort. good Air movement. Abd: Soft. flat. NTND. BS present. no Masses palpated. No hepatosplenomegaly. No rebound tenderness or guarding. Skin: no rashes, purpura or petechiae. Warm and well-perfused. Skin  intact. Neuro/Msk: Normal gait. PERLA. EOMi. Alert. Oriented x3.  Cranial nerves II through XII intact. Muscle strength 5/5 upper/lower extremity. DTRs equal bilaterally. Psych: Normal affect, dress and demeanor. Normal speech. Normal thought content and judgment.   No results found.  Assessment/plan: Aimee Swanson is a 65 y.o. female present for CPE Essential hypertension/HLD/Morbid obesity/Adult BMI 31.0-31.9 kg/sq m Stable.  - continue  Losartan 25 mg QD.  - continue lipitor 20 mg qd.  - low sodium  - routine  exercise.  - CBC with Differential/Platelet - Comprehensive metabolic panel - Lipid panel - TSH  OSA (obstructive sleep apnea) Follows with pulm  Diabetes mellitus screening - Hemoglobin A1c  Encounter for general adult medical examination  with abnormal findings Colonoscopy: completed 2022,  - one tubular adenoma other hyperplastic. Dr C- waiting on recs.  Mammogram: completed: 03/2021 with GYN ([place]) Cervical cancer screening: last pap:1/ 2021 with GYN- 5 yr Immunizations: tdap 04/2018, Influenza (encouraged yearly), shingrix series completed. covid series completed. Infectious disease screening: HIV and Hep C completed DEXA: ordered [facility]-normal Patient was encouraged to exercise greater than 150 minutes a week. Patient was encouraged to choose a diet filled with fresh fruits and vegetables, and lean meats. AVS provided to patient today for education/recommendation on gender specific health and safety maintenance.  Return in about 6 months (around 12/20/2021) for [place] (30 min) and 1 yr 1 d cpe.    Orders Placed This Encounter  Procedures   CBC with Differential/Platelet   Comprehensive metabolic panel   Hemoglobin A1c   Lipid panel   TSH   Meds ordered this encounter  Medications   atorvastatin (LIPITOR) 20 MG tablet    Sig: Take 1 tablet (20 mg total) by mouth daily.    Dispense:  90 tablet    Refill:  3   losartan (COZAAR) 25 MG tablet    Sig:  Take 1 tablet (25 mg total) by mouth daily.    Dispense:  90 tablet    Refill:  1   Referral Orders  No referral(s) requested today     Electronically signed by: Howard Pouch, [place]

## 2021-07-04 LAB — LIPID PANEL
Cholesterol: 150 mg/dL (ref 0–200)
HDL: 51.2 mg/dL (ref >39.00)
LDL Cholesterol: 80 mg/dL (ref 0–99)
NonHDL: 99.13
Total CHOL/HDL Ratio: 3
Triglycerides: 94 mg/dL (ref 0.0–149.0)
VLDL: 18.8 mg/dL (ref 0.0–40.0)

## 2021-07-04 LAB — HEMOGLOBIN A1C: Hgb A1c MFr Bld: 6 % (ref 4.6–6.5)

## 2021-07-04 LAB — CBC WITH DIFFERENTIAL/PLATELET
Basophils Absolute: 0.1 10*3/uL (ref 0.0–0.1)
Basophils Relative: 1.1 % (ref 0.0–3.0)
Eosinophils Absolute: 0.4 10*3/uL (ref 0.0–0.7)
Eosinophils Relative: 5 % (ref 0.0–5.0)
HCT: 40.1 % (ref 36.0–46.0)
Hemoglobin: 13.1 g/dL (ref 12.0–15.0)
Lymphocytes Relative: 37.4 % (ref 12.0–46.0)
Lymphs Abs: 2.8 10*3/uL (ref 0.7–4.0)
MCHC: 32.6 g/dL (ref 30.0–36.0)
MCV: 87.9 fl (ref 78.0–100.0)
Monocytes Absolute: 0.5 10*3/uL (ref 0.1–1.0)
Monocytes Relative: 7 % (ref 3.0–12.0)
Neutro Abs: 3.7 10*3/uL (ref 1.4–7.7)
Neutrophils Relative %: 49.5 % (ref 43.0–77.0)
Platelets: 289 10*3/uL (ref 150.0–400.0)
RBC: 4.56 Mil/uL (ref 3.87–5.11)
RDW: 13.9 % (ref 11.5–15.5)
WBC: 7.5 10*3/uL (ref 4.0–10.5)

## 2021-07-04 LAB — COMPREHENSIVE METABOLIC PANEL WITH GFR
ALT: 13 U/L (ref 0–35)
AST: 14 U/L (ref 0–37)
Albumin: 4.4 g/dL (ref 3.5–5.2)
Alkaline Phosphatase: 88 U/L (ref 39–117)
BUN: 14 mg/dL (ref 6–23)
CO2: 25 meq/L (ref 19–32)
Calcium: 9.7 mg/dL (ref 8.4–10.5)
Chloride: 101 meq/L (ref 96–112)
Creatinine, Ser: 0.68 mg/dL (ref 0.40–1.20)
GFR: 92.36 mL/min (ref >60.00)
Glucose, Bld: 76 mg/dL (ref 70–99)
Potassium: 4.1 meq/L (ref 3.5–5.1)
Sodium: 136 meq/L (ref 135–145)
Total Bilirubin: 0.8 mg/dL (ref 0.2–1.2)
Total Protein: 6.9 g/dL (ref 6.0–8.3)

## 2021-07-04 LAB — TSH: TSH: 1.73 u[IU]/mL (ref 0.35–5.50)

## 2021-07-05 ENCOUNTER — Telehealth: Payer: Self-pay | Admitting: Family Medicine

## 2021-07-05 NOTE — Telephone Encounter (Signed)
Noted  

## 2021-07-05 NOTE — Telephone Encounter (Signed)
Patient returned call for lab results and I relayed Dr. Lucita Lora message as follows: Liver, kidney and thyroid function are normal Blood cell counts and electrolytes are normal Diabetes screening/A1c is stable at 6.0 Cholesterol panel looks GREAT!. Patient voiced understanding.

## 2021-09-05 ENCOUNTER — Encounter: Payer: BC Managed Care – PPO | Admitting: Family Medicine

## 2021-12-20 ENCOUNTER — Ambulatory Visit: Payer: BC Managed Care – PPO | Admitting: Family Medicine

## 2021-12-20 ENCOUNTER — Other Ambulatory Visit: Payer: Self-pay

## 2021-12-20 ENCOUNTER — Encounter: Payer: Self-pay | Admitting: Family Medicine

## 2021-12-20 VITALS — BP 132/79 | HR 69 | Temp 98.3°F | Ht 64.0 in | Wt 187.0 lb

## 2021-12-20 DIAGNOSIS — E785 Hyperlipidemia, unspecified: Secondary | ICD-10-CM

## 2021-12-20 DIAGNOSIS — Z6831 Body mass index (BMI) 31.0-31.9, adult: Secondary | ICD-10-CM

## 2021-12-20 DIAGNOSIS — I10 Essential (primary) hypertension: Secondary | ICD-10-CM | POA: Diagnosis not present

## 2021-12-20 DIAGNOSIS — M1710 Unilateral primary osteoarthritis, unspecified knee: Secondary | ICD-10-CM

## 2021-12-20 DIAGNOSIS — G4733 Obstructive sleep apnea (adult) (pediatric): Secondary | ICD-10-CM

## 2021-12-20 MED ORDER — ATORVASTATIN CALCIUM 20 MG PO TABS: 20.0000 mg | ORAL_TABLET | Freq: Every day | ORAL | 3 refills | Status: DC

## 2021-12-20 MED ORDER — LOSARTAN POTASSIUM 25 MG PO TABS: 25.0000 mg | ORAL_TABLET | Freq: Every day | ORAL | 1 refills | Status: DC

## 2021-12-20 NOTE — Progress Notes (Signed)
This visit occurred during the SARS-CoV-2 public health emergency.  Safety protocols were in place, including screening questions prior to the visit, additional usage of staff PPE, and extensive cleaning of exam room while observing appropriate contact time as indicated for disinfecting solutions.    Patient ID: Aimee Swanson, female  DOB: 08-24-1956, 66 y.o.   MRN: [id number] Patient Care Team    Relationship Specialty Notifications Start End  Ma Hillock, DO PCP - General Family Medicine  05/01/18   Brien Few, MD Consulting Physician Obstetrics and Gynecology  05/05/18   Rutherford Guys, MD Consulting Physician Ophthalmology  05/05/18     Chief Complaint  Patient presents with   Hypertension    [facility]; pt is not fasting    Subjective: Aimee Swanson is a 66 y.o.  Female  present for [facility] All past medical history, surgical history, allergies, family history, immunizations, medications and social history were updated in the electronic medical record today. All recent labs, ED visits and hospitalizations within the last year were reviewed.  Hypertension/hyperlipidemia/obesity: Pt reports compliance  with losartan 25 mg qd. Blood pressures ranges at home normal. Patient denies chest pain, shortness of breath, dizziness or lower extremity edema.  Pt is  prescribed statin.  Depression screen [facility] 2/9 12/20/2021 07/03/2021 09/27/2020 06/23/2020 06/07/2020  Decreased Interest 0 0 0 0 0  Down, Depressed, Hopeless 0 0 0 0 0  PHQ - 2 Score 0 0 0 0 0   No flowsheet data found.  Immunization History  Administered Date(s) Administered   Influenza Inj Mdck Quad With Preservative 08/07/2017   Influenza Whole 08/13/2011   Influenza-Unspecified 09/12/2020, 08/12/2021   PFIZER(Purple Top)SARS-COV-2 Vaccination 01/09/2020, 02/08/2020, 09/29/2020   Pfizer Covid-19 Vaccine Bivalent Booster 35yrs & up 10/30/2021   Tdap 05/01/2018   Zoster Recombinat (Shingrix) 05/01/2018, 08/12/2018   Past  Medical History:  Diagnosis Date   Cervical strain 06/07/2020   Chicken pox    COVID-19 05/2021   Gynecological examination    sees Dr. Ronita Hipps    Hyperlipidemia    Hypertension    Obstructive sleep apnea    sees Dr. Gwenette Greet    Shingles    at age 44    Sleep apnea    Allergies  Allergen Reactions   Ibuprofen Swelling   Penicillins Rash   Past Surgical History:  Procedure Laterality Date   CESAREAN SECTION  1999   COLONOSCOPY  06-28-11   per Dr. Olevia Perches, clear, repeat in 10 yrs    LEFT [place]   left   [place]  2009   Family History  Problem Relation Age of Onset   Asthma Mother    Skin cancer Mother    Hearing loss Mother    Hearing loss Father    Parkinson's disease Father    Hearing loss Brother    Hypertension Brother    Stroke Maternal Grandmother    Lung cancer Maternal Grandfather        lung/fhx   Stroke Paternal Grandmother    Arthritis Other        fhx   Hyperlipidemia Other        fhx   Colon polyps Neg Hx    Colon cancer Neg Hx    Esophageal cancer Neg Hx    Rectal cancer Neg Hx    Stomach cancer Neg Hx    Social History   Social  History Narrative   One child, married.    BA- Pharmacist, hospital.    Former smoker.    Uses seat belt, bike helmet, smoke alarm in the home   Uses herbal remedies.    Feels safe in her relationships.     Allergies as of 12/20/2021       Reactions   Ibuprofen Swelling   Penicillins Rash        Medication List        Accurate as of December 20, 2021  3:45 PM. If you have any questions, ask your nurse or doctor.          atorvastatin 20 MG tablet Commonly known as: LIPITOR Take 1 tablet (20 mg total) by mouth daily.   losartan 25 MG tablet Commonly known as: Cozaar Take 1 tablet (25 mg total) by mouth daily.        All past medical history, surgical history, allergies, family history, immunizations andmedications were updated  in the EMR today and reviewed under the history and medication portions of their EMR.     No results found for this or any previous visit (from the past 2160 hour(s)).  ROS: 14 pt review of systems performed and negative (unless mentioned in an HPI)  Objective: BP 132/79    Pulse 69    Temp 98.3 F (36.8 C) (Oral)    Ht 5\' 4"  (1.626 m)    Wt 187 lb (84.8 kg)    SpO2 99%    BMI 32.10 kg/m  Physical Exam Vitals and nursing note reviewed.  Constitutional:      General: She is not in acute distress.    Appearance: Normal appearance. She is normal weight. She is not ill-appearing or toxic-appearing.  Eyes:     Extraocular Movements: Extraocular movements intact.     Conjunctiva/sclera: Conjunctivae normal.     Pupils: Pupils are equal, round, and reactive to light.  Cardiovascular:     Rate and Rhythm: Normal rate and regular rhythm.     Heart sounds: No murmur heard.   No gallop.  Pulmonary:     Effort: Pulmonary effort is normal. No respiratory distress.     Breath sounds: Normal breath sounds. No stridor. No wheezing, rhonchi or rales.  Musculoskeletal:     Right lower leg: No edema.     Left lower leg: No edema.  Neurological:     Mental Status: She is alert and oriented to person, place, and time. Mental status is at baseline.  Psychiatric:        Mood and Affect: Mood normal.        Behavior: Behavior normal.        Thought Content: Thought content normal.        Judgment: Judgment normal.    No results found.  Assessment/plan: Aimee Swanson is a 66 y.o. female present for Essential hypertension/HLD/Morbid obesity/Adult BMI 31.0-31.9 kg/sq m Stable.  - continue  Losartan 25 mg QD.  - continue lipitor 20 mg qd.  - low sodium  - routine  exercise.  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 Request Dr. Maureen Ralphs. - Ambulatory referral to Orthopedic Surgery  Return in about 28 weeks  (around 07/04/2022) for CPE (30 min), [facility] (30 min).   Orders Placed This Encounter  Procedures   Ambulatory referral to Orthopedic Surgery   Meds ordered this encounter  Medications  losartan (COZAAR) 25 MG tablet    Sig: Take 1 tablet (25 mg total) by mouth daily.    Dispense:  90 tablet    Refill:  1   atorvastatin (LIPITOR) 20 MG tablet    Sig: Take 1 tablet (20 mg total) by mouth daily.    Dispense:  90 tablet    Refill:  3   Referral Orders         Ambulatory referral to Orthopedic Surgery      Electronically signed by: Howard Pouch, DO [place]

## 2022-04-03 ENCOUNTER — Encounter: Payer: Self-pay | Admitting: Family Medicine

## 2022-04-03 ENCOUNTER — Telehealth (INDEPENDENT_AMBULATORY_CARE_PROVIDER_SITE_OTHER): Payer: BC Managed Care – PPO | Admitting: Family Medicine

## 2022-04-03 DIAGNOSIS — J329 Chronic sinusitis, unspecified: Secondary | ICD-10-CM | POA: Diagnosis not present

## 2022-04-03 DIAGNOSIS — B9689 Other specified bacterial agents as the cause of diseases classified elsewhere: Secondary | ICD-10-CM | POA: Diagnosis not present

## 2022-04-03 MED ORDER — DOXYCYCLINE HYCLATE 100 MG PO TABS: 100.0000 mg | ORAL_TABLET | Freq: Two times a day (BID) | ORAL | 0 refills | Status: DC

## 2022-04-03 MED ORDER — PREDNISONE 20 MG PO TABS: ORAL_TABLET | ORAL | 0 refills | Status: DC

## 2022-04-03 NOTE — Progress Notes (Signed)
VIRTUAL VISIT VIA VIDEO  I connected with Aimee Swanson on 04/03/22 at  1:40 PM EDT by a video enabled telemedicine application and verified that I am speaking with the correct person using two identifiers. Location patient: Home Location provider: [facility], Office Persons participating in the virtual visit: Patient, Dr. Raoul Pitch and Cyndra Numbers, CMA  I discussed the limitations of evaluation and management by telemedicine and the availability of in person appointments. The patient expressed understanding and agreed to proceed.     Aimee Swanson , Jun 24, 1956, 66 y.o., female MRN: [id number] Patient Care Team    Relationship Specialty Notifications Start End  Ma Hillock, DO PCP - General Family Medicine  05/01/18   Brien Few, MD Consulting Physician Obstetrics and Gynecology  05/05/18   Rutherford Guys, MD Consulting Physician Ophthalmology  05/05/18     Chief Complaint  Patient presents with   Nasal Congestion    Pt c/o nasal congestion, hoarseness, post nasal drip, nasal drainage with yellow tint x 3 week     Subjective: Pt presents for an OV with complaints of nasal congestion, cough, hoarseness, and and sinus pressure  of 3 weeks duration.    Pt has tried zyrtec and floanse to ease their symptoms.      12/20/2021    3:22 PM 07/03/2021    3:01 PM 09/27/2020    3:24 PM 06/23/2020    8:59 AM 06/07/2020    2:56 PM  Depression screen PHQ 2/9  Decreased Interest 0 0 0 0 0  Down, Depressed, Hopeless 0 0 0 0 0  PHQ - 2 Score 0 0 0 0 0    Allergies  Allergen Reactions   Ibuprofen Swelling   Penicillins Rash   Social History   Social History Narrative   One child, married.    BA- Pharmacist, hospital.    Former smoker.    Uses seat belt, bike helmet, smoke alarm in the home   Uses herbal remedies.    Feels safe in her relationships.    Past Medical History:  Diagnosis Date   Cervical strain 06/07/2020   Chicken pox    COVID-19 05/2021   Gynecological  examination    sees Dr. Ronita Hipps    Hyperlipidemia    Hypertension    Obstructive sleep apnea    sees Dr. Gwenette Greet    Shingles    at age 48    Sleep apnea    Past Surgical History:  Procedure Laterality Date   [place]   COLONOSCOPY  06-28-11   per Dr. Olevia Perches, clear, repeat in 10 yrs    [place]   left   [place]  2009   Family History  Problem Relation Age of Onset   Asthma Mother    Skin cancer Mother    Hearing loss Mother    Hearing loss Father    Parkinson's disease Father    Hearing loss Brother    Hypertension Brother    Stroke Maternal Grandmother    Lung cancer Maternal Grandfather        lung/fhx   Stroke Paternal Grandmother    Arthritis Other        fhx   Hyperlipidemia Other        fhx   Colon polyps Neg Hx    Colon cancer Neg Hx  Esophageal cancer Neg Hx    Rectal cancer Neg Hx    Stomach cancer Neg Hx    Allergies as of 04/03/2022       Reactions   Ibuprofen Swelling   Penicillins Rash        Medication List        Accurate as of Apr 03, 2022  2:02 PM. If you have any questions, ask your nurse or doctor.          atorvastatin 20 MG tablet Commonly known as: LIPITOR Take 1 tablet (20 mg total) by mouth daily.   doxycycline 100 MG tablet Commonly known as: VIBRA-TABS Take 1 tablet (100 mg total) by mouth 2 (two) times daily. Started by: Howard Pouch, DO   losartan 25 MG tablet Commonly known as: Cozaar Take 1 tablet (25 mg total) by mouth daily.   predniSONE 20 MG tablet Commonly known as: DELTASONE 60 mg x3d, 40 mg x3d, 20 mg x2d, 10 mg x2d Started by: Howard Pouch, DO        All past medical history, surgical history, allergies, family history, immunizations andmedications were updated in the EMR today and reviewed under the history and medication portions of their EMR.     Review of Systems  Constitutional:  Positive for  malaise/fatigue. Negative for chills and fever.  HENT:  Positive for congestion, sinus pain and sore throat. Negative for ear discharge, ear pain and tinnitus.   Eyes:  Negative for pain, discharge and redness.  Respiratory:  Positive for cough and sputum production. Negative for shortness of breath and wheezing.   Gastrointestinal:  Negative for diarrhea, nausea and vomiting.  Skin:  Negative for rash.  Neurological:  Positive for headaches. Negative for dizziness.  Negative, with the exception of above mentioned in HPI   Objective:  There were no vitals taken for this visit. There is no height or weight on file to calculate BMI. Physical Exam Constitutional:      General: She is not in acute distress.    Appearance: Normal appearance. She is normal weight. She is not ill-appearing or toxic-appearing.  HENT:     Nose: Congestion and rhinorrhea present.  Eyes:     General: No scleral icterus.       Right eye: No discharge.        Left eye: No discharge.     Pupils: Pupils are equal, round, and reactive to light.  Pulmonary:     Effort: Pulmonary effort is normal.  Skin:    Findings: No rash.  Neurological:     Mental Status: She is alert.     No results found. No results found. No results found for this or any previous visit (from the past 24 hour(s)).  Assessment/Plan: Aimee Swanson is a 66 y.o. female present for OV for  1. Bacterial sinusitis Rest, hydrate.  +/- flonase, mucinex (DM if cough), nettie pot or nasal saline.  Continue zyrtec Doxy bid and prednisone  prescribed, take until completed.  F/U 2 weeks if not improved.   Reviewed expectations re: course of current medical issues. Discussed self-management of symptoms. Outlined signs and symptoms indicating need for more acute intervention. Patient verbalized understanding and all questions were answered. Patient received an After-Visit Summary.    No orders of the defined types were placed in this  encounter.  Meds ordered this encounter  Medications   predniSONE (DELTASONE) 20 MG tablet    Sig: 60 mg x3d, 40 mg x3d, 20 mg  x2d, 10 mg x2d    Dispense:  18 tablet    Refill:  0   doxycycline (VIBRA-TABS) 100 MG tablet    Sig: Take 1 tablet (100 mg total) by mouth 2 (two) times daily.    Dispense:  20 tablet    Refill:  0   Referral Orders  No referral(s) requested today     Note is dictated utilizing voice recognition software. Although note has been proof read prior to signing, occasional typographical errors still can be missed. If any questions arise, please do not hesitate to call for verification.   electronically signed by:  Howard Pouch, DO  [place]

## 2022-04-03 NOTE — Patient Instructions (Signed)
No follow-ups on file.        Great to see you today.  I have refilled the medication(s) we provide.   If labs were collected, we will inform you of lab results once received either by echart message or telephone call.   - echart message- for normal results that have been seen by the patient already.   - telephone call: abnormal results or if patient has not viewed results in their echart.  

## 2022-06-20 DIAGNOSIS — R87810 Cervical high risk human papillomavirus (HPV) DNA test positive: Secondary | ICD-10-CM | POA: Insufficient documentation

## 2022-06-20 DIAGNOSIS — B977 Papillomavirus as the cause of diseases classified elsewhere: Secondary | ICD-10-CM

## 2022-06-20 HISTORY — DX: Papillomavirus as the cause of diseases classified elsewhere: B97.7

## 2022-06-26 LAB — HM PAP SMEAR

## 2022-06-26 LAB — HM MAMMOGRAPHY

## 2022-07-04 ENCOUNTER — Encounter: Payer: BC Managed Care – PPO | Admitting: Family Medicine

## 2022-07-05 LAB — HM DEXA SCAN: HM Dexa Scan: NORMAL

## 2022-07-06 ENCOUNTER — Ambulatory Visit (INDEPENDENT_AMBULATORY_CARE_PROVIDER_SITE_OTHER): Payer: BC Managed Care – PPO | Admitting: Family Medicine

## 2022-07-06 ENCOUNTER — Encounter: Payer: Self-pay | Admitting: Family Medicine

## 2022-07-06 VITALS — BP 118/75 | HR 81 | Temp 98.1°F | Ht 64.0 in | Wt 193.0 lb

## 2022-07-06 DIAGNOSIS — Z0000 Encounter for general adult medical examination without abnormal findings: Secondary | ICD-10-CM | POA: Diagnosis not present

## 2022-07-06 DIAGNOSIS — E785 Hyperlipidemia, unspecified: Secondary | ICD-10-CM | POA: Diagnosis not present

## 2022-07-06 DIAGNOSIS — Z79899 Other long term (current) drug therapy: Secondary | ICD-10-CM | POA: Diagnosis not present

## 2022-07-06 DIAGNOSIS — Z23 Encounter for immunization: Secondary | ICD-10-CM | POA: Diagnosis not present

## 2022-07-06 DIAGNOSIS — I10 Essential (primary) hypertension: Secondary | ICD-10-CM | POA: Diagnosis not present

## 2022-07-06 MED ORDER — LOSARTAN POTASSIUM 25 MG PO TABS: 25.0000 mg | ORAL_TABLET | Freq: Every day | ORAL | 1 refills | Status: DC

## 2022-07-06 MED ORDER — ATORVASTATIN CALCIUM 20 MG PO TABS
20.0000 mg | ORAL_TABLET | Freq: Every day | ORAL | 3 refills | Status: DC
Start: 2022-07-06 — End: 2022-12-19

## 2022-07-06 NOTE — Progress Notes (Signed)
Patient ID: Aimee Swanson, female  DOB: Nov 30, 1955, 66 y.o.   MRN: [id number] Patient Care Team    Relationship Specialty Notifications Start End  Ma Hillock, DO PCP - General Family Medicine  05/01/18   Brien Few, MD Consulting Physician Obstetrics and Gynecology  05/05/18   Rutherford Guys, MD Consulting Physician Ophthalmology  05/05/18     Chief Complaint  Patient presents with   Annual Exam    [facility]; pt is fasting    Subjective: Aimee Swanson is a 66 y.o.  Female  present for [place] All past medical history, surgical history, allergies, family history, immunizations, medications and social history were updated in the electronic medical record today. All recent labs, ED visits and hospitalizations within the last year were reviewed.  Health maintenance:  Colonoscopy: completed 2022,  - one tubular adenoma other hyperplastic. Dr C- waiting on recs.  Mammogram: completed: UTD 2023 with GYN ([facility]) Cervical cancer screening: last pap:1/ 2021 with GYN- 5 yr Immunizations: tdap 04/2018, Influenza administered today(encouraged yearly), shingrix series completed. covid series completed. Infectious disease screening: HIV and Hep C completed DEXA: ordered BC-[place]-normal Assistive device: None Oxygen use: None Patient has a Dental home. Hospitalizations/ED visits: Reviewed   Hypertension/hyperlipidemia/obesity: Pt reports compliance with losartan 25 mg qd. Blood pressures ranges at home normal. Patient denies chest pain, shortness of breath, dizziness or lower extremity edema.  Pt is  prescribed statin.     07/06/2022    3:51 PM 12/20/2021    3:22 PM 07/03/2021    3:01 PM 09/27/2020    3:24 PM 06/23/2020    8:59 AM  Depression screen PHQ 2/9  Decreased Interest 0 0 0 0 0  Down, Depressed, Hopeless 0 0 0 0 0  PHQ - 2 Score 0 0 0 0 0       No data to display          Immunization History  Administered Date(s) Administered   Influenza Inj Mdck Quad With  Preservative 08/07/2017   Influenza Whole 08/13/2011   Influenza,inj,Quad PF,6+ Mos 07/06/2022   Influenza-Unspecified 09/12/2020, 08/12/2021   PFIZER(Purple Top)SARS-COV-2 Vaccination 01/09/2020, 02/08/2020, 09/29/2020   Pfizer Covid-19 Vaccine Bivalent Booster 82yr & up 10/30/2021   Tdap 05/01/2018   Zoster Recombinat (Shingrix) 05/01/2018, 08/12/2018     Past Medical History:  Diagnosis Date   Cervical strain 06/07/2020   Chicken pox    COVID-19 05/2021   Gynecological examination    sees Dr. TRonita Hipps   Hyperlipidemia    Hypertension    Obstructive sleep apnea    sees Dr. CGwenette Greet   Shingles    at age 66   Sleep apnea    Allergies  Allergen Reactions   Ibuprofen Swelling   Penicillins Rash   Past Surgical History:  Procedure Laterality Date   [place]  COLONOSCOPY  06-28-11   per Dr. BOlevia Perches clear, repeat in 10 yrs    [place]  left   [place]   Family History  Problem Relation Age of Onset   Asthma Mother    Skin cancer Mother    Hearing loss Mother    Hearing loss Father    Parkinson's disease Father    Hearing loss Brother    Hypertension Brother    Stroke Maternal Grandmother    Lung cancer Maternal Grandfather  lung/fhx   Stroke Paternal Grandmother    Arthritis Other        fhx   Hyperlipidemia Other        fhx   Colon polyps Neg Hx    Colon cancer Neg Hx    Esophageal cancer Neg Hx    Rectal cancer Neg Hx    Stomach cancer Neg Hx    Social History   Social History Narrative   One child, married.    BA- Pharmacist, hospital.    Former smoker.    Uses seat belt, bike helmet, smoke alarm in the home   Uses herbal remedies.    Feels safe in her relationships.     Allergies as of 07/06/2022       Reactions   Ibuprofen Swelling   Penicillins Rash        Medication List        Accurate as of July 06, 2022  5:21 PM. If you have  any questions, ask your nurse or doctor.          STOP taking these medications    doxycycline 100 MG tablet Commonly known as: VIBRA-TABS Stopped by: Howard Pouch, DO   predniSONE 20 MG tablet Commonly known as: DELTASONE Stopped by: Howard Pouch, DO       TAKE these medications    atorvastatin 20 MG tablet Commonly known as: LIPITOR Take 1 tablet (20 mg total) by mouth daily.   losartan 25 MG tablet Commonly known as: Cozaar Take 1 tablet (25 mg total) by mouth daily.        All past medical history, surgical history, allergies, family history, immunizations andmedications were updated in the EMR today and reviewed under the history and medication portions of their EMR.     Recent Results (from the past 2160 hour(s))  HM MAMMOGRAPHY     Status: None   Collection Time: 06/26/22 12:00 AM  Result Value Ref Range   HM Mammogram 0-4 Bi-Rad 0-4 Bi-Rad, Self Reported Normal    DG Bone Density Result Date: 10/10/2020 ASSESSMENT: The BMD measured at Femur Neck Left is 0.931 g/cm2 with a T-score of -0.8.   ROS 14 pt review of systems performed and negative (unless mentioned in an HPI)  Objective: BP 118/75   Pulse 81   Temp 98.1 F (36.7 C) (Oral)   Ht '5\' 4"'$  (1.626 m)   Wt 193 lb (87.5 kg)   SpO2 98%   BMI 33.13 kg/m  Physical Exam Vitals and nursing note reviewed.  Constitutional:      General: She is not in acute distress.    Appearance: Normal appearance. She is not ill-appearing or toxic-appearing.  HENT:     Head: Normocephalic and atraumatic.     Right Ear: Tympanic membrane, ear canal and external ear normal. There is no impacted cerumen.     Left Ear: Tympanic membrane, ear canal and external ear normal. There is no impacted cerumen.     Nose: No congestion or rhinorrhea.     Mouth/Throat:     Mouth: Mucous membranes are moist.     Pharynx: Oropharynx is clear. No oropharyngeal exudate or posterior oropharyngeal erythema.  Eyes:     General: No  scleral icterus.       Right eye: No discharge.        Left eye: No discharge.     Extraocular Movements: Extraocular movements intact.     Conjunctiva/sclera: Conjunctivae normal.     Pupils: Pupils are  equal, round, and reactive to light.  Cardiovascular:     Rate and Rhythm: Normal rate and regular rhythm.     Pulses: Normal pulses.     Heart sounds: Normal heart sounds. No murmur heard.    No friction rub. No gallop.  Pulmonary:     Effort: Pulmonary effort is normal. No respiratory distress.     Breath sounds: Normal breath sounds. No stridor. No wheezing, rhonchi or rales.  Chest:     Chest wall: No tenderness.  Abdominal:     General: Abdomen is flat. Bowel sounds are normal. There is no distension.     Palpations: Abdomen is soft. There is no mass.     Tenderness: There is no abdominal tenderness. There is no right CVA tenderness, left CVA tenderness, guarding or rebound.     Hernia: No hernia is present.  Musculoskeletal:        General: No swelling, tenderness or deformity. Normal range of motion.     Cervical back: Normal range of motion and neck supple. No rigidity or tenderness.     Right lower leg: No edema.     Left lower leg: No edema.  Lymphadenopathy:     Cervical: No cervical adenopathy.  Skin:    General: Skin is warm and dry.     Coloration: Skin is not jaundiced or pale.     Findings: No bruising, erythema, lesion or rash.  Neurological:     General: No focal deficit present.     Mental Status: She is alert and oriented to person, place, and time. Mental status is at baseline.     Cranial Nerves: No cranial nerve deficit.     Sensory: No sensory deficit.     Motor: No weakness.     Coordination: Coordination normal.     Gait: Gait normal.     Deep Tendon Reflexes: Reflexes normal.  Psychiatric:        Mood and Affect: Mood normal.        Behavior: Behavior normal.        Thought Content: Thought content normal.        Judgment: Judgment normal.       No results found.  Assessment/plan: MARVIS BAKKEN is a 66 y.o. female present for CPE/CMC Essential hypertension/HLD/Morbid obesity/Adult BMI 31.0-31.9 kg/sq m Stable.  - continue  Losartan 25 mg QD.  - continue lipitor 20 mg qd.  - low sodium  - routine  exercise.  - CBC - Comprehensive metabolic panel - Lipid panel - TSH  OSA (obstructive sleep apnea) Follows with pulm  Encounter for long-term current use of medication - Hemoglobin A1c Routine general medical examination at a health care facility Colonoscopy: completed 2022,  - one tubular adenoma other hyperplastic. Dr C- waiting on recs.  Mammogram: completed: UTD 2023 with GYN ([facility]) Cervical cancer screening: last pap:1/ 2021 with GYN- 5 yr Immunizations: tdap 04/2018, Influenza administered today(encouraged yearly), shingrix series completed. covid series completed. Infectious disease screening: HIV and Hep C completed DEXA: ordered [facility]-[place]-normal Patient was encouraged to exercise greater than 150 minutes a week. Patient was encouraged to choose a diet filled with fresh fruits and vegetables, and lean meats. AVS provided to patient today for education/recommendation on gender specific health and safety maintenance.  Return in about 24 weeks (around 12/21/2022).  Orders Placed This Encounter  Procedures   Flu Vaccine QUAD 6+ mos PF IM (Fluarix Quad PF)   CBC   Comprehensive metabolic panel  Hemoglobin A1c   Lipid panel   TSH    Meds ordered this encounter  Medications   losartan (COZAAR) 25 MG tablet    Sig: Take 1 tablet (25 mg total) by mouth daily.    Dispense:  90 tablet    Refill:  1   atorvastatin (LIPITOR) 20 MG tablet    Sig: Take 1 tablet (20 mg total) by mouth daily.    Dispense:  90 tablet    Refill:  3   Referral Orders  No referral(s) requested today     Electronically signed by: Howard Pouch, [place]

## 2022-07-06 NOTE — Patient Instructions (Signed)
No follow-ups on file.        Great to see you today.  I have refilled the medication(s) we provide.   If labs were collected, we will inform you of lab results once received either by echart message or telephone call.   - echart message- for normal results that have been seen by the patient already.   - telephone call: abnormal results or if patient has not viewed results in their echart.  Health Maintenance, Female Adopting a healthy lifestyle and getting preventive care are important in promoting health and wellness. Ask your health care provider about: The right schedule for you to have regular tests and exams. Things you can do on your own to prevent diseases and keep yourself healthy. What should I know about diet, weight, and exercise? Eat a healthy diet  Eat a diet that includes plenty of vegetables, fruits, low-fat dairy products, and lean protein. Do not eat a lot of foods that are high in solid fats, added sugars, or sodium. Maintain a healthy weight Body mass index (BMI) is used to identify weight problems. It estimates body fat based on height and weight. Your health care provider can help determine your BMI and help you achieve or maintain a healthy weight. Get regular exercise Get regular exercise. This is one of the most important things you can do for your health. Most adults should: Exercise for at least 150 minutes each week. The exercise should increase your heart rate and make you sweat (moderate-intensity exercise). Do strengthening exercises at least twice a week. This is in addition to the moderate-intensity exercise. Spend less time sitting. Even light physical activity can be beneficial. Watch cholesterol and blood lipids Have your blood tested for lipids and cholesterol at 65 years of age, then have this test every 5 years. Have your cholesterol levels checked more often if: Your lipid or cholesterol levels are high. You are older than 65 years of  age. You are at high risk for heart disease. What should I know about cancer screening? Depending on your health history and family history, you may need to have cancer screening at various ages. This may include screening for: Breast cancer. Cervical cancer. Colorectal cancer. Skin cancer. Lung cancer. What should I know about heart disease, diabetes, and high blood pressure? Blood pressure and heart disease High blood pressure causes heart disease and increases the risk of stroke. This is more likely to develop in people who have high blood pressure readings or are overweight. Have your blood pressure checked: Every 3-5 years if you are 18-39 years of age. Every year if you are 40 years old or older. Diabetes Have regular diabetes screenings. This checks your fasting blood sugar level. Have the screening done: Once every three years after age 40 if you are at a normal weight and have a low risk for diabetes. More often and at a younger age if you are overweight or have a high risk for diabetes. What should I know about preventing infection? Hepatitis B If you have a higher risk for hepatitis B, you should be screened for this virus. Talk with your health care provider to find out if you are at risk for hepatitis B infection. Hepatitis C Testing is recommended for: Everyone born from 1945 through 1965. Anyone with known risk factors for hepatitis C. Sexually transmitted infections (STIs) Get screened for STIs, including gonorrhea and chlamydia, if: You are sexually active and are younger than 65 years of age. You are   older than 65 years of age and your health care provider tells you that you are at risk for this type of infection. Your sexual activity has changed since you were last screened, and you are at increased risk for chlamydia or gonorrhea. Ask your health care provider if you are at risk. Ask your health care provider about whether you are at high risk for HIV. Your health  care provider may recommend a prescription medicine to help prevent HIV infection. If you choose to take medicine to prevent HIV, you should first get tested for HIV. You should then be tested every 3 months for as long as you are taking the medicine. Pregnancy If you are about to stop having your period (premenopausal) and you may become pregnant, seek counseling before you get pregnant. Take 400 to 800 micrograms (mcg) of folic acid every day if you become pregnant. Ask for birth control (contraception) if you want to prevent pregnancy. Osteoporosis and menopause Osteoporosis is a disease in which the bones lose minerals and strength with aging. This can result in bone fractures. If you are 65 years old or older, or if you are at risk for osteoporosis and fractures, ask your health care provider if you should: Be screened for bone loss. Take a calcium or vitamin D supplement to lower your risk of fractures. Be given hormone replacement therapy (HRT) to treat symptoms of menopause. Follow these instructions at home: Alcohol use Do not drink alcohol if: Your health care provider tells you not to drink. You are pregnant, may be pregnant, or are planning to become pregnant. If you drink alcohol: Limit how much you have to: 0-1 drink a day. Know how much alcohol is in your drink. In the [place] one drink equals one 12 oz bottle of beer (355 mL), one 5 oz glass of wine (148 mL), or one 1 oz glass of hard liquor (44 mL). Lifestyle Do not use any products that contain nicotine or tobacco. These products include cigarettes, chewing tobacco, and vaping devices, such as e-cigarettes. If you need help quitting, ask your health care provider. Do not use street drugs. Do not share needles. Ask your health care provider for help if you need support or information about quitting drugs. General instructions Schedule regular health, dental, and eye exams. Stay current with your vaccines. Tell your health  care provider if: You often feel depressed. You have ever been abused or do not feel safe at home. Summary Adopting a healthy lifestyle and getting preventive care are important in promoting health and wellness. Follow your health care provider's instructions about healthy diet, exercising, and getting tested or screened for diseases. Follow your health care provider's instructions on monitoring your cholesterol and blood pressure. This information is not intended to replace advice given to you by your health care provider. Make sure you discuss any questions you have with your health care provider. Document Revised: 03/20/2021 Document Reviewed: 03/20/2021 Elsevier Patient Education  2023 Elsevier Inc.  

## 2022-07-07 LAB — COMPREHENSIVE METABOLIC PANEL
AG Ratio: 1.8 (calc) (ref 1.0–2.5)
ALT: 10 U/L (ref 6–29)
AST: 13 U/L (ref 10–35)
Albumin: 4.5 g/dL (ref 3.6–5.1)
Alkaline phosphatase (APISO): 91 U/L (ref 37–153)
BUN: 18 mg/dL (ref 7–25)
CO2: 22 mmol/L (ref 20–32)
Calcium: 9.9 mg/dL (ref 8.6–10.4)
Chloride: 103 mmol/L (ref 98–110)
Creat: 0.85 mg/dL (ref 0.50–1.05)
Globulin: 2.5 g/dL (calc) (ref 1.9–3.7)
Glucose, Bld: 85 mg/dL (ref 65–99)
Potassium: 4.6 mmol/L (ref 3.5–5.3)
Sodium: 139 mmol/L (ref 135–146)
Total Bilirubin: 0.9 mg/dL (ref 0.2–1.2)
Total Protein: 7 g/dL (ref 6.1–8.1)

## 2022-07-07 LAB — LIPID PANEL
Cholesterol: 151 mg/dL (ref <200)
HDL: 53 mg/dL (ref >=50)
LDL Cholesterol (Calc): 82 mg/dL (calc)
Non-HDL Cholesterol (Calc): 98 mg/dL (calc) (ref <130)
Total CHOL/HDL Ratio: 2.8 (calc) (ref <5.0)
Triglycerides: 78 mg/dL (ref <150)

## 2022-07-07 LAB — CBC
HCT: 40 % (ref 35.0–45.0)
Hemoglobin: 13.5 g/dL (ref 11.7–15.5)
MCH: 28.8 pg (ref 27.0–33.0)
MCHC: 33.8 g/dL (ref 32.0–36.0)
MCV: 85.3 fL (ref 80.0–100.0)
MPV: 10.3 fL (ref 7.5–12.5)
Platelets: 275 10*3/uL (ref 140–400)
RBC: 4.69 10*6/uL (ref 3.80–5.10)
RDW: 13 % (ref 11.0–15.0)
WBC: 8.3 10*3/uL (ref 3.8–10.8)

## 2022-07-07 LAB — HEMOGLOBIN A1C
Hgb A1c MFr Bld: 5.6 % of total Hgb (ref <5.7)
Mean Plasma Glucose: 114 mg/dL
eAG (mmol/L): 6.3 mmol/L

## 2022-07-07 LAB — TSH: TSH: 2.32 mIU/L (ref 0.40–4.50)

## 2022-07-09 ENCOUNTER — Telehealth: Payer: Self-pay

## 2022-07-09 NOTE — Telephone Encounter (Signed)
Patient returned call from this morning regarding her lab results.  She stated she had seen all her results thru her mychart.   Patient is aware all labs are normal.  She has no questions.  No follow up needed at this time.

## 2022-07-10 NOTE — Telephone Encounter (Signed)
Noted  

## 2022-12-19 ENCOUNTER — Encounter: Payer: Self-pay | Admitting: Family Medicine

## 2022-12-19 ENCOUNTER — Ambulatory Visit: Payer: BC Managed Care – PPO | Admitting: Family Medicine

## 2022-12-19 VITALS — BP 132/80 | HR 69 | Temp 98.1°F | Wt 192.0 lb

## 2022-12-19 DIAGNOSIS — G4733 Obstructive sleep apnea (adult) (pediatric): Secondary | ICD-10-CM

## 2022-12-19 DIAGNOSIS — E785 Hyperlipidemia, unspecified: Secondary | ICD-10-CM

## 2022-12-19 DIAGNOSIS — I10 Essential (primary) hypertension: Secondary | ICD-10-CM

## 2022-12-19 DIAGNOSIS — Z23 Encounter for immunization: Secondary | ICD-10-CM | POA: Diagnosis not present

## 2022-12-19 MED ORDER — ATORVASTATIN CALCIUM 20 MG PO TABS: 20.0000 mg | ORAL_TABLET | Freq: Every day | ORAL | 3 refills | Status: DC

## 2022-12-19 MED ORDER — LOSARTAN POTASSIUM 25 MG PO TABS: 25.0000 mg | ORAL_TABLET | Freq: Every day | ORAL | 1 refills | Status: DC

## 2022-12-19 NOTE — Progress Notes (Signed)
Patient ID: Aimee Swanson, female  DOB: 02-Feb-1956, 67 y.o.   MRN: [id number] Patient Care Team    Relationship Specialty Notifications Start End  Ma Hillock, DO PCP - General Family Medicine  05/01/18   Brien Few, MD Consulting Physician Obstetrics and Gynecology  05/05/18   Rutherford Guys, MD Consulting Physician Ophthalmology  05/05/18     Chief Complaint  Patient presents with   Hypertension    cmc    Subjective: Aimee Swanson is a 67 y.o.  Female  present for Chronic Conditions/illness Management  All past medical history, surgical history, allergies, family history, immunizations, medications and social history were updated in the electronic medical record today. All recent labs, ED visits and hospitalizations within the last year were reviewed.  Hypertension/hyperlipidemia/obesity: Pt reports compliance with losartan 25 mg qd.  Patient denies chest pain, shortness of breath, dizziness or lower extremity edema.   Pt is  prescribed statin.     07/06/2022    3:51 PM 12/20/2021    3:22 PM 07/03/2021    3:01 PM 09/27/2020    3:24 PM 06/23/2020    8:59 AM  Depression screen PHQ 2/9  Decreased Interest 0 0 0 0 0  Down, Depressed, Hopeless 0 0 0 0 0  PHQ - 2 Score 0 0 0 0 0       No data to display          Immunization History  Administered Date(s) Administered   Influenza Inj Mdck Quad With Preservative 08/07/2017   Influenza Whole 08/13/2011   Influenza,inj,Quad PF,6+ Mos 07/06/2022   Influenza-Unspecified 09/12/2020, 08/12/2021   PFIZER(Purple Top)SARS-COV-2 Vaccination 01/09/2020, 02/08/2020, 09/29/2020   Pfizer Covid-19 Vaccine Bivalent Booster 46yr & up 10/30/2021   Td 11/12/2006   Tdap 05/01/2018   Zoster Recombinat (Shingrix) 05/01/2018, 08/12/2018     Past Medical History:  Diagnosis Date   Atypical squamous cells of undetermined significance (ASCUS) on Papanicolaou smear of cervix 03/10/2021   08/28/2001   Cervical strain  06/07/2020   Chicken pox    COVID-19 05/2021   Gynecological examination    sees Dr. 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 CGwenette Greet   Shingles    at age 67   Sleep apnea    Allergies  Allergen Reactions   Ibuprofen Swelling   Penicillins Rash   Past Surgical History:  Procedure Laterality Date   [place]  COLONOSCOPY  06-28-11   per Dr. BOlevia Perches clear, repeat in 10 yrs    [place]   Family History  Problem Relation Age of Onset   Asthma Mother    Skin cancer Mother    Hearing loss Mother    Hearing loss Father    Parkinson's disease Father    Hearing loss Brother    Hypertension Brother    Stroke Maternal Grandmother    Lung cancer Maternal Grandfather        lung/fhx   Stroke Paternal Grandmother    Arthritis Other        fhx   Hyperlipidemia Other        fhx   Colon polyps Neg Hx    Colon cancer Neg  Hx    Esophageal cancer Neg Hx    Rectal cancer Neg Hx    Stomach cancer Neg Hx    Social History   Social History Narrative   One child, married.    BA- Pharmacist, hospital.    Former smoker.    Uses seat belt, bike helmet, smoke alarm in the home   Uses herbal remedies.    Feels safe in her relationships.     Allergies as of 12/19/2022       Reactions   Ibuprofen Swelling   Penicillins Rash        Medication List        Accurate as of December 19, 2022  3:44 PM. If you have any questions, ask your nurse or doctor.          atorvastatin 20 MG tablet Commonly known as: LIPITOR Take 1 tablet (20 mg total) by mouth daily.   losartan 25 MG tablet Commonly known as: Cozaar Take 1 tablet (25 mg total) by mouth daily.        All past medical history, surgical history, allergies, family history, immunizations andmedications were updated in  the EMR today and reviewed under the history and medication portions of their EMR.     No results found for this or any previous visit (from the past 2160 hour(s)).   DG Bone Density Result Date: 10/10/2020 ASSESSMENT: The BMD measured at Femur Neck Left is 0.931 g/cm2 with a T-score of -0.8.   ROS 14 pt review of systems performed and negative (unless mentioned in an HPI)  Objective: BP 132/80   Pulse 69   Temp 98.1 F (36.7 C)   Wt 192 lb (87.1 kg)   SpO2 99%   BMI 32.96 kg/m  Physical Exam Vitals and nursing note reviewed.  Constitutional:      General: She is not in acute distress.    Appearance: Normal appearance. She is not ill-appearing, toxic-appearing or diaphoretic.  HENT:     Head: Normocephalic and atraumatic.  Eyes:     General: No scleral icterus.       Right eye: No discharge.        Left eye: No discharge.     Extraocular Movements: Extraocular movements intact.     Conjunctiva/sclera: Conjunctivae normal.     Pupils: Pupils are equal, round, and reactive to light.  Cardiovascular:     Rate and Rhythm: Normal rate and regular rhythm.  Pulmonary:     Effort: Pulmonary effort is normal. No respiratory distress.     Breath sounds: Normal breath sounds. No wheezing, rhonchi or rales.  Musculoskeletal:     Right lower leg: No edema.     Left lower leg: No edema.  Skin:    General: Skin is warm.     Findings: No rash.  Neurological:     Mental Status: She is alert and oriented to person, place, and time. Mental status is at baseline.     Motor: No weakness.     Gait: Gait normal.  Psychiatric:        Mood and Affect: Mood normal.        Behavior: Behavior normal.        Thought Content: Thought content normal.        Judgment: Judgment normal.     No results found.  Assessment/plan: Aimee Swanson is a 67 y.o. female present for Chronic Conditions/illness Management Essential hypertension/HLD/Morbid obesity/Adult BMI 31.0-31.9 kg/sq  m Stable Continue  Losartan 25 mg QD.  Continue lipitor 20 mg qd.  - low sodium  - routine  exercise.  Labs due next visit  OSA (obstructive sleep apnea) Follows with pulm   Return in about 7 months (around 07/11/2023) for cpe (20 min), Routine chronic condition follow-up.  No orders of the defined types were placed in this encounter.   Meds ordered this encounter  Medications   losartan (COZAAR) 25 MG tablet    Sig: Take 1 tablet (25 mg total) by mouth daily.    Dispense:  90 tablet    Refill:  1   atorvastatin (LIPITOR) 20 MG tablet    Sig: Take 1 tablet (20 mg total) by mouth daily.    Dispense:  90 tablet    Refill:  3   Referral Orders  No referral(s) requested today     Electronically signed by: Howard Pouch, [place]

## 2022-12-19 NOTE — Patient Instructions (Addendum)
Return in about 7 months (around 07/11/2023) for cpe (20 min), Routine chronic condition follow-up.        Great to see you today.  I have refilled the medication(s) we provide.   If labs were collected, we will inform you of lab results once received either by echart message or telephone call.   - echart message- for normal results that have been seen by the patient already.   - telephone call: abnormal results or if patient has not viewed results in their echart.  

## 2022-12-19 NOTE — Addendum Note (Signed)
Addended by: Beatrix Fetters on: 12/19/2022 04:11 PM   Modules accepted: Orders

## 2023-03-31 ENCOUNTER — Other Ambulatory Visit: Payer: Self-pay | Admitting: Family Medicine

## 2023-06-12 ENCOUNTER — Encounter (INDEPENDENT_AMBULATORY_CARE_PROVIDER_SITE_OTHER): Payer: Self-pay

## 2023-07-09 ENCOUNTER — Encounter: Payer: Self-pay | Admitting: Family Medicine

## 2023-07-09 ENCOUNTER — Ambulatory Visit (INDEPENDENT_AMBULATORY_CARE_PROVIDER_SITE_OTHER): Payer: Medicare HMO | Admitting: Family Medicine

## 2023-07-09 VITALS — BP 127/86 | HR 89 | Temp 98.3°F | Ht 64.0 in | Wt 192.2 lb

## 2023-07-09 DIAGNOSIS — I10 Essential (primary) hypertension: Secondary | ICD-10-CM | POA: Diagnosis not present

## 2023-07-09 DIAGNOSIS — E785 Hyperlipidemia, unspecified: Secondary | ICD-10-CM | POA: Diagnosis not present

## 2023-07-09 DIAGNOSIS — Z23 Encounter for immunization: Secondary | ICD-10-CM

## 2023-07-09 DIAGNOSIS — Z131 Encounter for screening for diabetes mellitus: Secondary | ICD-10-CM

## 2023-07-09 DIAGNOSIS — G4733 Obstructive sleep apnea (adult) (pediatric): Secondary | ICD-10-CM | POA: Diagnosis not present

## 2023-07-09 DIAGNOSIS — Z0000 Encounter for general adult medical examination without abnormal findings: Secondary | ICD-10-CM | POA: Diagnosis not present

## 2023-07-09 LAB — COMPREHENSIVE METABOLIC PANEL
ALT: 12 U/L (ref 0–35)
AST: 13 U/L (ref 0–37)
Albumin: 4.4 g/dL (ref 3.5–5.2)
Alkaline Phosphatase: 96 U/L (ref 39–117)
BUN: 17 mg/dL (ref 6–23)
CO2: 27 mEq/L (ref 19–32)
Calcium: 9.9 mg/dL (ref 8.4–10.5)
Chloride: 104 mEq/L (ref 96–112)
Creatinine, Ser: 0.76 mg/dL (ref 0.40–1.20)
GFR: 81.94 mL/min (ref >60.00)
Glucose, Bld: 89 mg/dL (ref 70–99)
Potassium: 4.6 mEq/L (ref 3.5–5.1)
Sodium: 141 mEq/L (ref 135–145)
Total Bilirubin: 0.9 mg/dL (ref 0.2–1.2)
Total Protein: 7 g/dL (ref 6.0–8.3)

## 2023-07-09 LAB — CBC WITH DIFFERENTIAL/PLATELET
Basophils Absolute: 0.1 10*3/uL (ref 0.0–0.1)
Basophils Relative: 1.1 % (ref 0.0–3.0)
Eosinophils Absolute: 0.4 10*3/uL (ref 0.0–0.7)
Eosinophils Relative: 5.2 % — ABNORMAL HIGH (ref 0.0–5.0)
HCT: 42.7 % (ref 36.0–46.0)
Hemoglobin: 13.8 g/dL (ref 12.0–15.0)
Lymphocytes Relative: 34.2 % (ref 12.0–46.0)
Lymphs Abs: 2.6 10*3/uL (ref 0.7–4.0)
MCHC: 32.3 g/dL (ref 30.0–36.0)
MCV: 88.2 fl (ref 78.0–100.0)
Monocytes Absolute: 0.6 10*3/uL (ref 0.1–1.0)
Monocytes Relative: 7.6 % (ref 3.0–12.0)
Neutro Abs: 4 10*3/uL (ref 1.4–7.7)
Neutrophils Relative %: 51.9 % (ref 43.0–77.0)
Platelets: 356 10*3/uL (ref 150.0–400.0)
RBC: 4.84 Mil/uL (ref 3.87–5.11)
RDW: 13.5 % (ref 11.5–15.5)
WBC: 7.7 10*3/uL (ref 4.0–10.5)

## 2023-07-09 LAB — LIPID PANEL
Cholesterol: 147 mg/dL (ref 0–200)
HDL: 46 mg/dL (ref >39.00)
LDL Cholesterol: 78 mg/dL (ref 0–99)
NonHDL: 101.04
Total CHOL/HDL Ratio: 3
Triglycerides: 113 mg/dL (ref 0.0–149.0)
VLDL: 22.6 mg/dL (ref 0.0–40.0)

## 2023-07-09 LAB — HEMOGLOBIN A1C: Hgb A1c MFr Bld: 5.9 % (ref 4.6–6.5)

## 2023-07-09 LAB — TSH: TSH: 2.92 u[IU]/mL (ref 0.35–5.50)

## 2023-07-09 MED ORDER — ATORVASTATIN CALCIUM 20 MG PO TABS: 20.0000 mg | ORAL_TABLET | Freq: Every day | ORAL | 3 refills | Status: DC

## 2023-07-09 MED ORDER — LOSARTAN POTASSIUM 25 MG PO TABS: 25.0000 mg | ORAL_TABLET | Freq: Every day | ORAL | 1 refills | Status: DC

## 2023-07-09 NOTE — Progress Notes (Signed)
Patient ID: Aimee Swanson, female  DOB: 16-Mar-1956, 67 y.o.   MRN: [id number] Patient Care Team    Relationship Specialty Notifications Start End  Natalia Leatherwood, DO PCP - General Family Medicine  05/01/18   Olivia Mackie, MD Consulting Physician Obstetrics and Gynecology  05/05/18   Jethro Bolus, MD Consulting Physician Ophthalmology  05/05/18   Shellia Cleverly, DO Consulting Physician Gastroenterology  07/09/23     Chief Complaint  Patient presents with   Annual Exam    Fasting CPE. Does want the flu shot today.    Subjective: Aimee Swanson is a 67 y.o.  Female  present for CPE and Chronic Conditions/illness Management  All past medical history, surgical history, allergies, family history, immunizations, medications and social history were updated in the electronic medical record today. All recent labs, ED visits and hospitalizations within the last year were reviewed.  Health maintenance:  Colonoscopy: completed 2022,  - one tubular adenoma other hyperplastic. Dr C- 7 yr follow up Mammogram: completed: UTD 06/26/2022 with GYN ([facility])-requested this years Immunizations: tdap 04/2018, Influenza completed today(encouraged yearly), shingrix series completed. covid series completed. Infectious disease screening: HIV and Hep C completed DEXA: 07/05/2022- normal. [facility] Assistive device: None Oxygen use: None Patient has a Dental home. Hospitalizations/ED visits: Reviewed  Hypertension/hyperlipidemia/obesity: Pt reports compliance with losartan 25 mg qd.  Patient denies chest pain, shortness of breath, dizziness or lower extremity edema.   lower extremity edema.  Pt is  prescribed statin.     07/09/2023    8:09 AM 02/28/2023    8:41 AM 07/06/2022    3:51 PM 12/20/2021    3:22 PM 07/03/2021    3:01 PM  Depression screen PHQ 2/9  Decreased Interest 0 0 0 0 0  Down, Depressed, Hopeless 0 0 0 0 0  PHQ - 2 Score 0 0 0 0 0  Altered sleeping 0      Tired,  decreased energy 0      Change in appetite 0      Feeling bad or failure about yourself  0      Trouble concentrating 0      Moving slowly or fidgety/restless 0      Suicidal thoughts 0      PHQ-9 Score 0      Difficult doing work/chores Not difficult at all          07/09/2023    8:10 AM  GAD 7 : Generalized Anxiety Score  Nervous, Anxious, on Edge 0  Control/stop worrying 0  Worry too much - different things 0  Trouble relaxing 0  Restless 0  Easily annoyed or irritable 0  Afraid - awful might happen 0  Total GAD 7 Score 0  Anxiety Difficulty Not difficult at all    Immunization History  Administered Date(s) Administered   Fluad Quad(high Dose 65+) 07/09/2023   Influenza Inj Mdck Quad With Preservative 08/07/2017   Influenza Whole 08/13/2011   Influenza,inj,Quad PF,6+ Mos 07/06/2022   Influenza-Unspecified 09/12/2020, 08/12/2021   PFIZER(Purple Top)SARS-COV-2 Vaccination 01/09/2020, 02/08/2020, 09/29/2020   PNEUMOCOCCAL CONJUGATE-20 12/19/2022   Pfizer Covid-19 Vaccine Bivalent Booster 44yrs & up 10/30/2021   Td 11/12/2006   Tdap 05/01/2018   Zoster Recombinant(Shingrix) 05/01/2018, 08/12/2018     Past Medical History:  Diagnosis Date   Atypical squamous cells of undetermined significance (ASCUS) on Papanicolaou smear of cervix 03/10/2021   08/28/2001   Cervical strain 06/07/2020   Chicken pox    COVID-19 05/2021  Gynecological examination    sees Dr. 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 Shelle Iron    Shingles    at age 80    Sleep apnea    Allergies  Allergen Reactions   Ibuprofen Swelling   Penicillins Rash   Past Surgical History:  Procedure Laterality Date   CESAREAN SECTION  1999   COLONOSCOPY  06-28-11   per Dr. Juanda Chance, clear, repeat in 10 yrs    LEFT OOPHORECTOMY  1996   MYOMECTOMY     ORIF TIBIA & FIBULA FRACTURES  1992   left   UTERINE FIBROID  SURGERY  2009   Family History  Problem Relation Age of Onset   Asthma Mother    Skin cancer Mother    Hearing loss Mother    Hearing loss Father    Parkinson's disease Father    Hearing loss Brother    Hypertension Brother    Stroke Maternal Grandmother    Lung cancer Maternal Grandfather        lung/fhx   Stroke Paternal Grandmother    Arthritis Other        fhx   Hyperlipidemia Other        fhx   Colon polyps Neg Hx    Colon cancer Neg Hx    Esophageal cancer Neg Hx    Rectal cancer Neg Hx    Stomach cancer Neg Hx    Social History   Social History Narrative   One child, married.    BA- Runner, broadcasting/film/video.    Former smoker.    Uses seat belt, bike helmet, smoke alarm in the home   Uses herbal remedies.    Feels safe in her relationships.     Allergies as of 07/09/2023       Reactions   Ibuprofen Swelling   Penicillins Rash        Medication List        Accurate as of July 09, 2023  8:34 AM. If you have any questions, ask your nurse or doctor.          atorvastatin 20 MG tablet Commonly known as: LIPITOR Take 1 tablet (20 mg total) by mouth daily.   losartan 25 MG tablet Commonly known as: Cozaar Take 1 tablet (25 mg total) by mouth daily.        All past medical history, surgical history, allergies, family history, immunizations andmedications were updated in the EMR today and reviewed under the history and medication portions of their EMR.     No results found for this or any previous visit (from the past 2160 hour(s)).   DG Bone Density Result Date: 10/10/2020 ASSESSMENT: The BMD measured at Femur Neck Left is 0.931 g/cm2 with a T-score of -0.8.   ROS 14 pt review of systems performed and negative (unless mentioned in an HPI)  Objective: BP 127/86   Pulse 89   Temp 98.3 F (36.8 C) (Oral)   Ht 5\' 4"  (1.626 m)   Wt 192 lb 3.2 oz (87.2 kg)   BMI 32.99 kg/m  Physical Exam Vitals and nursing note reviewed.  Constitutional:       General: She is not in acute distress.    Appearance: Normal appearance. She is obese. She is not ill-appearing or toxic-appearing.  HENT:     Head: Normocephalic and atraumatic.  Right Ear: Tympanic membrane, ear canal and external ear normal. There is no impacted cerumen.     Left Ear: Tympanic membrane, ear canal and external ear normal. There is no impacted cerumen.     Nose: No congestion or rhinorrhea.     Mouth/Throat:     Mouth: Mucous membranes are moist.     Pharynx: Oropharynx is clear. No oropharyngeal exudate or posterior oropharyngeal erythema.  Eyes:     General: No scleral icterus.       Right eye: No discharge.        Left eye: No discharge.     Extraocular Movements: Extraocular movements intact.     Conjunctiva/sclera: Conjunctivae normal.     Pupils: Pupils are equal, round, and reactive to light.  Cardiovascular:     Rate and Rhythm: Normal rate and regular rhythm.     Pulses: Normal pulses.     Heart sounds: Normal heart sounds. No murmur heard.    No friction rub. No gallop.  Pulmonary:     Effort: Pulmonary effort is normal. No respiratory distress.     Breath sounds: Normal breath sounds. No stridor. No wheezing, rhonchi or rales.  Chest:     Chest wall: No tenderness.  Abdominal:     General: Abdomen is flat. Bowel sounds are normal. There is no distension.     Palpations: Abdomen is soft. There is no mass.     Tenderness: There is no abdominal tenderness. There is no right CVA tenderness, left CVA tenderness, guarding or rebound.     Hernia: No hernia is present.  Musculoskeletal:        General: No swelling, tenderness or deformity. Normal range of motion.     Cervical back: Normal range of motion and neck supple. No rigidity or tenderness.     Right lower leg: No edema.     Left lower leg: No edema.  Lymphadenopathy:     Cervical: No cervical adenopathy.  Skin:    General: Skin is warm and dry.     Coloration: Skin is not jaundiced or pale.      Findings: No bruising, erythema, lesion or rash.  Neurological:     General: No focal deficit present.     Mental Status: She is alert and oriented to person, place, and time. Mental status is at baseline.     Cranial Nerves: No cranial nerve deficit.     Sensory: No sensory deficit.     Motor: No weakness.     Coordination: Coordination normal.     Gait: Gait normal.     Deep Tendon Reflexes: Reflexes normal.  Psychiatric:        Mood and Affect: Mood normal.        Behavior: Behavior normal.        Thought Content: Thought content normal.        Judgment: Judgment normal.      No results found.  Assessment/plan: Aimee Swanson is a 67 y.o. female present for CPE and Chronic Conditions/illness Management Essential hypertension/HLD/Morbid obesity/Adult BMI 31.0-31.9 kg/sq m Stable Continue Losartan 25 mg QD.  - continue lipitor 20 mg qd.  - low sodium  - routine  exercise.  -cbc, cmp, tsh and lipids collected today  Routine general medical examination at a health care facility Patient was encouraged to exercise greater than 150 minutes a week. Patient was encouraged to choose a diet filled with fresh fruits and vegetables, and lean meats. AVS provided to  patient today for education/recommendation on gender specific health and safety maintenance. Colonoscopy: completed 2022,  - one tubular adenoma other hyperplastic. Dr C- 7 yr follow up Mammogram: completed: UTD 06/26/2022 with GYN ([facility])-requested this years Immunizations: tdap 04/2018, Influenza completed today(encouraged yearly), shingrix series completed. covid series completed. Infectious disease screening: HIV and Hep C completed DEXA: 07/05/2022- normal. [facility]  Return in about 24 weeks (around 12/24/2023) for Routine chronic condition follow-up.  Orders Placed This Encounter  Procedures   Flu Vaccine QUAD High Dose(Fluad)   CBC with Differential/Platelet   Comprehensive metabolic panel   Hemoglobin A1c    TSH   Lipid panel    Meds ordered this encounter  Medications   atorvastatin (LIPITOR) 20 MG tablet    Sig: Take 1 tablet (20 mg total) by mouth daily.    Dispense:  90 tablet    Refill:  3   losartan (COZAAR) 25 MG tablet    Sig: Take 1 tablet (25 mg total) by mouth daily.    Dispense:  90 tablet    Refill:  1   Referral Orders  No referral(s) requested today     Electronically signed by: Felix Pacini, DO [facility]

## 2023-07-09 NOTE — Patient Instructions (Addendum)

## 2023-07-10 NOTE — Addendum Note (Signed)
Addended by: Filomena Jungling on: 07/10/2023 03:51 PM   Modules accepted: Orders

## 2023-07-16 DIAGNOSIS — Z024 Encounter for examination for driving license: Secondary | ICD-10-CM | POA: Diagnosis not present

## 2023-08-06 DIAGNOSIS — Z880 Allergy status to penicillin: Secondary | ICD-10-CM | POA: Diagnosis not present

## 2023-08-06 DIAGNOSIS — Z8249 Family history of ischemic heart disease and other diseases of the circulatory system: Secondary | ICD-10-CM | POA: Diagnosis not present

## 2023-08-06 DIAGNOSIS — Z6833 Body mass index (BMI) 33.0-33.9, adult: Secondary | ICD-10-CM | POA: Diagnosis not present

## 2023-08-06 DIAGNOSIS — E785 Hyperlipidemia, unspecified: Secondary | ICD-10-CM | POA: Diagnosis not present

## 2023-08-06 DIAGNOSIS — Z8349 Family history of other endocrine, nutritional and metabolic diseases: Secondary | ICD-10-CM | POA: Diagnosis not present

## 2023-08-06 DIAGNOSIS — Z820 Family history of epilepsy and other diseases of the nervous system: Secondary | ICD-10-CM | POA: Diagnosis not present

## 2023-08-06 DIAGNOSIS — E669 Obesity, unspecified: Secondary | ICD-10-CM | POA: Diagnosis not present

## 2023-08-06 DIAGNOSIS — I10 Essential (primary) hypertension: Secondary | ICD-10-CM | POA: Diagnosis not present

## 2023-08-06 DIAGNOSIS — Z008 Encounter for other general examination: Secondary | ICD-10-CM | POA: Diagnosis not present

## 2023-08-06 DIAGNOSIS — Z974 Presence of external hearing-aid: Secondary | ICD-10-CM | POA: Diagnosis not present

## 2023-08-06 DIAGNOSIS — Z886 Allergy status to analgesic agent status: Secondary | ICD-10-CM | POA: Diagnosis not present

## 2023-09-10 DIAGNOSIS — Z1231 Encounter for screening mammogram for malignant neoplasm of breast: Secondary | ICD-10-CM | POA: Diagnosis not present

## 2023-09-10 LAB — HM MAMMOGRAPHY

## 2023-12-19 DIAGNOSIS — M1712 Unilateral primary osteoarthritis, left knee: Secondary | ICD-10-CM | POA: Diagnosis not present

## 2023-12-19 DIAGNOSIS — M25562 Pain in left knee: Secondary | ICD-10-CM | POA: Diagnosis not present

## 2023-12-24 ENCOUNTER — Encounter: Payer: Self-pay | Admitting: Family Medicine

## 2023-12-24 ENCOUNTER — Ambulatory Visit: Payer: Medicare HMO | Admitting: Family Medicine

## 2023-12-24 VITALS — BP 136/82 | HR 83 | Temp 98.0°F | Wt 190.4 lb

## 2023-12-24 DIAGNOSIS — G4733 Obstructive sleep apnea (adult) (pediatric): Secondary | ICD-10-CM | POA: Diagnosis not present

## 2023-12-24 DIAGNOSIS — I10 Essential (primary) hypertension: Secondary | ICD-10-CM | POA: Diagnosis not present

## 2023-12-24 DIAGNOSIS — E785 Hyperlipidemia, unspecified: Secondary | ICD-10-CM | POA: Diagnosis not present

## 2023-12-24 MED ORDER — LOSARTAN POTASSIUM 50 MG PO TABS: 50.0000 mg | ORAL_TABLET | Freq: Every day | ORAL | 1 refills | Status: DC

## 2023-12-24 NOTE — Patient Instructions (Addendum)
Return in about 7 months (around 07/09/2024) for cpe (20 min), Routine chronic condition follow-up.        Great to see you today.  I have refilled the medication(s) we provide.   If labs were collected or images ordered, we will inform you of  results once we have received them and reviewed. We will contact you either by echart message, or telephone call.  Please give ample time to the testing facility, and our office to run,  receive and review results. Please do not call inquiring of results, even if you can see them in your chart. We will contact you as soon as we are able. If it has been over 1 week since the test was completed, and you have not yet heard from [place], then please call us.    - echart message- for normal results that have been seen by the patient already.   - telephone call: abnormal results or if patient has not viewed results in their echart.  If a referral to a specialist was entered for you, please call us in 2 weeks if you have not heard from the specialist office to schedule.

## 2023-12-24 NOTE — Progress Notes (Signed)
Patient ID: Aimee Swanson, female  DOB: 1956/03/17, 68 y.o.   MRN: [id number] Patient Care Team    Relationship Specialty Notifications Start End  Aimee Leatherwood, DO PCP - General Family Medicine  05/01/18   Aimee Mackie, MD Consulting Physician Obstetrics and Gynecology  05/05/18   Aimee Bolus, MD Consulting Physician Ophthalmology  05/05/18   Aimee Cleverly, DO Consulting Physician Gastroenterology  07/09/23   Aimee Gross, MD Consulting Physician Orthopedic Surgery  12/24/23     Chief Complaint  Patient presents with   Hypertension   Hyperlipidemia    Pt is fasting.     Subjective: Aimee Swanson is a 68 y.o.  Female  present for Chronic Conditions/illness Management  All past medical history, surgical history, allergies, family history, immunizations, medications and social history were updated in the electronic medical record today. All recent labs, ED visits and hospitalizations within the last year were reviewed.  Hypertension/hyperlipidemia/E66.01: Pt reports compliance with losartan 25 mg qd.  Patient denies chest pain, shortness of breath, dizziness or lower extremity edema.  Pt is  prescribed statin and compliant.     12/24/2023    8:07 AM 07/09/2023    8:09 AM 02/28/2023    8:41 AM 07/06/2022    3:51 PM 12/20/2021    3:22 PM  Depression screen PHQ 2/9  Decreased Interest 0 0 0 0 0  Down, Depressed, Hopeless 0 0 0 0 0  PHQ - 2 Score 0 0 0 0 0  Altered sleeping 0 0     Tired, decreased energy 0 0     Change in appetite 0 0     Feeling bad or failure about yourself  0 0     Trouble concentrating 0 0     Moving slowly or fidgety/restless 0 0     Suicidal thoughts 0 0     PHQ-9 Score 0 0     Difficult doing work/chores Not difficult at all Not difficult at all         12/24/2023    8:07 AM 07/09/2023    8:10 AM  GAD 7 : Generalized Anxiety Score  Nervous, Anxious, on Edge 0 0  Control/stop worrying 0 0  Worry too much - different things 0 0   Trouble relaxing 0 0  Restless 0 0  Easily annoyed or irritable 0 0  Afraid - awful might happen 0 0  Total GAD 7 Score 0 0  Anxiety Difficulty Not difficult at all Not difficult at all    Immunization History  Administered Date(s) Administered   Fluad Trivalent(High Dose 65+) 07/09/2023   Influenza Inj Mdck Quad With Preservative 08/07/2017   Influenza Whole 08/13/2011   Influenza,inj,Quad PF,6+ Mos 07/06/2022   Influenza-Unspecified 09/12/2020, 08/12/2021   PFIZER(Purple Top)SARS-COV-2 Vaccination 01/09/2020, 02/08/2020, 09/29/2020   PNEUMOCOCCAL CONJUGATE-20 12/19/2022   Pfizer Covid-19 Vaccine Bivalent Booster 15yrs & up 10/30/2021   Td 11/12/2006   Tdap 05/01/2018   Zoster Recombinant(Shingrix) 05/01/2018, 08/12/2018     Past Medical History:  Diagnosis Date   Atypical squamous cells of undetermined significance (ASCUS) on Papanicolaou smear of cervix 03/10/2021   08/28/2001   Cervical strain 06/07/2020   Chicken pox    COVID-19 05/2021   Gynecological examination    sees Dr. 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 Shelle Swanson    Shingles  at age 13    Sleep apnea    Allergies  Allergen Reactions   Ibuprofen Swelling   Penicillins Rash   Past Surgical History:  Procedure Laterality Date   CESAREAN SECTION  1999   COLONOSCOPY  06-28-11   per Dr. Juanda Chance, clear, repeat in 10 yrs    LEFT OOPHORECTOMY  1996   MYOMECTOMY     ORIF TIBIA & FIBULA FRACTURES  1992   left   UTERINE FIBROID SURGERY  2009   Family History  Problem Relation Age of Onset   Asthma Mother    Skin cancer Mother    Hearing loss Mother    Hearing loss Father    Parkinson's disease Father    Hearing loss Brother    Hypertension Brother    Stroke Maternal Grandmother    Lung cancer Maternal Grandfather        lung/fhx   Stroke Paternal Grandmother    Arthritis Other        fhx   Hyperlipidemia  Other        fhx   Colon polyps Neg Hx    Colon cancer Neg Hx    Esophageal cancer Neg Hx    Rectal cancer Neg Hx    Stomach cancer Neg Hx    Social History   Social History Narrative   One child, married.    BA- Runner, broadcasting/film/video.    Former smoker.    Uses seat belt, bike helmet, smoke alarm in the home   Uses herbal remedies.    Feels safe in her relationships.     Allergies as of 12/24/2023       Reactions   Ibuprofen Swelling   Penicillins Rash        Medication List        Accurate as of December 24, 2023  8:32 AM. If you have any questions, ask your nurse or doctor.          atorvastatin 20 MG tablet Commonly known as: LIPITOR Take 1 tablet (20 mg total) by mouth daily.   losartan 50 MG tablet Commonly known as: Cozaar Take 1 tablet (50 mg total) by mouth daily. What changed:  medication strength how much to take Changed by: Felix Pacini        All past medical history, surgical history, allergies, family history, immunizations andmedications were updated in the EMR today and reviewed under the history and medication portions of their EMR.     No results found for this or any previous visit (from the past 2160 hours).   DG Bone Density Result Date: 10/10/2020 ASSESSMENT: The BMD measured at Femur Neck Left is 0.931 g/cm2 with a T-score of -0.8.   ROS 14 pt review of systems performed and negative (unless mentioned in an HPI)  Objective: BP 136/82   Pulse 83   Temp 98 F (36.7 C)   Wt 190 lb 6.4 oz (86.4 kg)   SpO2 97%   BMI 32.68 kg/m  Physical Exam Vitals and nursing note reviewed.  Constitutional:      General: She is not in acute distress.    Appearance: Normal appearance. She is not ill-appearing, toxic-appearing or diaphoretic.  HENT:     Head: Normocephalic and atraumatic.  Eyes:     General: No scleral icterus.       Right eye: No discharge.        Left eye: No discharge.     Extraocular Movements: Extraocular movements intact.  Conjunctiva/sclera: Conjunctivae normal.     Pupils: Pupils are equal, round, and reactive to light.  Cardiovascular:     Rate and Rhythm: Normal rate and regular rhythm.  Pulmonary:     Effort: Pulmonary effort is normal. No respiratory distress.     Breath sounds: Normal breath sounds. No wheezing, rhonchi or rales.  Musculoskeletal:     Right lower leg: No edema.     Left lower leg: No edema.  Skin:    General: Skin is warm.     Findings: No rash.  Neurological:     Mental Status: She is alert and oriented to person, place, and time. Mental status is at baseline.     Motor: No weakness.     Gait: Gait normal.  Psychiatric:        Mood and Affect: Mood normal.        Behavior: Behavior normal.        Thought Content: Thought content normal.        Judgment: Judgment normal.      No results found.  Assessment/plan: ANESA FRONEK is a 68 y.o. female present Chronic Conditions/illness Management Essential hypertension/HLD/E66.01 Not at goal (<130/80 Increase Losartan to 50 mg QD.  - continue lipitor 20 mg qd.  - low sodium  - routine  exercise.  -labs UTD and due next visit  OSA- Stable-  - testing completed in 2012 - CPAP not required   Return in about 7 months (around 07/09/2024) for cpe (20 min), Routine chronic condition follow-up.  No orders of the defined types were placed in this encounter.   Meds ordered this encounter  Medications   losartan (COZAAR) 50 MG tablet    Sig: Take 1 tablet (50 mg total) by mouth daily.    Dispense:  90 tablet    Refill:  1   Referral Orders  No referral(s) requested today     Electronically signed by: Felix Pacini, DO [facility] [place]

## 2024-07-10 ENCOUNTER — Ambulatory Visit (INDEPENDENT_AMBULATORY_CARE_PROVIDER_SITE_OTHER): Payer: Medicare HMO | Admitting: Family Medicine

## 2024-07-10 ENCOUNTER — Encounter: Payer: Self-pay | Admitting: Family Medicine

## 2024-07-10 VITALS — BP 130/80 | HR 79 | Temp 98.2°F | Ht 64.0 in | Wt 192.6 lb

## 2024-07-10 DIAGNOSIS — I10 Essential (primary) hypertension: Secondary | ICD-10-CM

## 2024-07-10 DIAGNOSIS — Z0000 Encounter for general adult medical examination without abnormal findings: Secondary | ICD-10-CM | POA: Diagnosis not present

## 2024-07-10 DIAGNOSIS — Z131 Encounter for screening for diabetes mellitus: Secondary | ICD-10-CM | POA: Diagnosis not present

## 2024-07-10 DIAGNOSIS — E785 Hyperlipidemia, unspecified: Secondary | ICD-10-CM | POA: Diagnosis not present

## 2024-07-10 DIAGNOSIS — G4733 Obstructive sleep apnea (adult) (pediatric): Secondary | ICD-10-CM | POA: Diagnosis not present

## 2024-07-10 DIAGNOSIS — D126 Benign neoplasm of colon, unspecified: Secondary | ICD-10-CM | POA: Insufficient documentation

## 2024-07-10 LAB — LIPID PANEL
Cholesterol: 147 mg/dL (ref 0–200)
HDL: 53.4 mg/dL (ref >39.00)
LDL Cholesterol: 75 mg/dL (ref 0–99)
NonHDL: 93.41
Total CHOL/HDL Ratio: 3
Triglycerides: 92 mg/dL (ref 0.0–149.0)
VLDL: 18.4 mg/dL (ref 0.0–40.0)

## 2024-07-10 LAB — CBC
HCT: 43.3 % (ref 36.0–46.0)
Hemoglobin: 14.1 g/dL (ref 12.0–15.0)
MCHC: 32.7 g/dL (ref 30.0–36.0)
MCV: 87.7 fl (ref 78.0–100.0)
Platelets: 321 K/uL (ref 150.0–400.0)
RBC: 4.94 Mil/uL (ref 3.87–5.11)
RDW: 13.9 % (ref 11.5–15.5)
WBC: 7.5 K/uL (ref 4.0–10.5)

## 2024-07-10 LAB — COMPREHENSIVE METABOLIC PANEL WITH GFR
ALT: 15 U/L (ref 0–35)
AST: 15 U/L (ref 0–37)
Albumin: 4.6 g/dL (ref 3.5–5.2)
Alkaline Phosphatase: 97 U/L (ref 39–117)
BUN: 26 mg/dL — ABNORMAL HIGH (ref 6–23)
CO2: 26 meq/L (ref 19–32)
Calcium: 9.8 mg/dL (ref 8.4–10.5)
Chloride: 105 meq/L (ref 96–112)
Creatinine, Ser: 0.72 mg/dL (ref 0.40–1.20)
GFR: 86.81 mL/min (ref >60.00)
Glucose, Bld: 110 mg/dL — ABNORMAL HIGH (ref 70–99)
Potassium: 4.8 meq/L (ref 3.5–5.1)
Sodium: 142 meq/L (ref 135–145)
Total Bilirubin: 0.7 mg/dL (ref 0.2–1.2)
Total Protein: 7.1 g/dL (ref 6.0–8.3)

## 2024-07-10 LAB — TSH: TSH: 2.65 u[IU]/mL (ref 0.35–5.50)

## 2024-07-10 LAB — HEMOGLOBIN A1C: Hgb A1c MFr Bld: 6.2 % (ref 4.6–6.5)

## 2024-07-10 MED ORDER — LOSARTAN POTASSIUM 50 MG PO TABS: 50.0000 mg | ORAL_TABLET | Freq: Every day | ORAL | 1 refills | Status: AC

## 2024-07-10 MED ORDER — ATORVASTATIN CALCIUM 20 MG PO TABS: 20.0000 mg | ORAL_TABLET | Freq: Every day | ORAL | 3 refills | Status: AC

## 2024-07-10 NOTE — Patient Instructions (Addendum)
 Return in about 24 weeks (around 12/25/2024) for Routine chronic condition follow-up.        Great to see you today.  I have refilled the medication(s) we provide.   If labs were collected or images ordered, we will inform you of  results once we have received them and reviewed. We will contact you either by echart message, or telephone call.  Please give ample time to the testing facility, and our office to run,  receive and review results. Please do not call inquiring of results, even if you can see them in your chart. We will contact you as soon as we are able. If it has been over 1 week since the test was completed, and you have not yet heard from us , then please call us .    - echart message- for normal results that have been seen by the patient already.   - telephone call: abnormal results or if patient has not viewed results in their echart.  If a referral to a specialist was entered for you, please call us  in 2 weeks if you have not heard from the specialist office to schedule.

## 2024-07-10 NOTE — Progress Notes (Signed)
 Patient ID: Aimee Swanson, female  DOB: 02/15/1956, 68 y.o.   MRN: [id number] Patient Care Team    Relationship Specialty Notifications Start End  Catherine Charlies LABOR, DO PCP - General Family Medicine  05/01/18   Gorge Ade, MD Consulting Physician Obstetrics and Gynecology  05/05/18   Roz Anes, MD Consulting Physician Ophthalmology  05/05/18   San Sandor GAILS, DO Consulting Physician Gastroenterology  07/09/23   Melodi Lerner, MD Consulting Physician Orthopedic Surgery  12/24/23     Chief Complaint  Patient presents with   Annual Exam    Chronic Conditions/illness Management. Pt is fasting.     Subjective: Aimee Swanson is a 68 y.o.  Female  present for CPE and combine chronic Conditions/illness Management  All past medical history, surgical history, allergies, family history, immunizations, medications and social history were updated in the electronic medical record today. All recent labs, ED visits and hospitalizations within the last year were reviewed.  Health maintenance:  Colonoscopy: completed 2022,  - one tubular adenoma other hyperplastic. Dr C- 7 yr follow up Mammogram: completed: 09/10/2023 with GYN ([facility]) Immunizations: tdap 04/2018, Influenza (encouraged yearly), shingrix  series completed. covid series completed.  Prevnar completed 2024 Infectious disease screening: HIV and Hep C completed DEXA: 07/05/2022- normal. [facility] Assistive device: None Oxygen use: None Patient has a Dental home. Hospitalizations/ED visits: Reviewed  Hypertension/hyperlipidemia/E66.01: Pt reports compliance with losartan  50 mg qd (increased last visit).  Patient denies chest pain, shortness of breath, dizziness or lower extremity edema.   Pt is  prescribed statin and compliant.     07/10/2024    8:00 AM 12/24/2023    8:07 AM 07/09/2023    8:09 AM 02/28/2023    8:41 AM 07/06/2022    3:51 PM  Depression screen PHQ 2/9  Decreased Interest 0 0 0 0 0  Down,  Depressed, Hopeless 0 0 0 0 0  PHQ - 2 Score 0 0 0 0 0  Altered sleeping  0 0    Tired, decreased energy  0 0    Change in appetite  0 0    Feeling bad or failure about yourself   0 0    Trouble concentrating  0 0    Moving slowly or fidgety/restless  0 0    Suicidal thoughts  0 0    PHQ-9 Score  0 0    Difficult doing work/chores  Not difficult at all Not difficult at all        12/24/2023    8:07 AM 07/09/2023    8:10 AM  GAD 7 : Generalized Anxiety Score  Nervous, Anxious, on Edge 0 0  Control/stop worrying 0 0  Worry too much - different things 0 0  Trouble relaxing 0 0  Restless 0 0  Easily annoyed or irritable 0 0  Afraid - awful might happen 0 0  Total GAD 7 Score 0 0  Anxiety Difficulty Not difficult at all Not difficult at all    Immunization History  Administered Date(s) Administered   Fluad Trivalent(High Dose 65+) 07/09/2023   Influenza Inj Mdck Quad With Preservative 08/07/2017   Influenza Whole 08/13/2011   Influenza,inj,Quad PF,6+ Mos 07/06/2022   Influenza-Unspecified 09/12/2020, 08/12/2021   PFIZER(Purple Top)SARS-COV-2 Vaccination 01/09/2020, 02/08/2020, 09/29/2020   PNEUMOCOCCAL CONJUGATE-20 12/19/2022   Pfizer Covid-19 Vaccine Bivalent Booster 59yrs & up 10/30/2021   Pfizer(Comirnaty)Fall Seasonal Vaccine 12 years and older 08/07/2023   Td 11/12/2006   Tdap 05/01/2018   Zoster Recombinant(Shingrix ) 05/01/2018,  08/12/2018     Past Medical History:  Diagnosis Date   Atypical squamous cells of undetermined significance (ASCUS) on Papanicolaou smear of cervix 03/10/2021   08/28/2001   Cervical strain 06/07/2020   Chicken pox    COVID-19 05/2021   Gynecological examination    sees Dr. 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 Corrie    Shingles    at age 18    Sleep apnea    Allergies  Allergen Reactions   Ibuprofen Swelling   Penicillins Rash   Past Surgical History:  Procedure Laterality Date   CESAREAN SECTION  1999   COLONOSCOPY  06-28-11   per Dr. Obie, clear, repeat in 10 yrs    LEFT OOPHORECTOMY  1996   MYOMECTOMY     ORIF TIBIA & FIBULA FRACTURES  1992   left   UTERINE FIBROID SURGERY  2009   Family History  Problem Relation Age of Onset   Asthma Mother    Skin cancer Mother    Hearing loss Mother    Hearing loss Father    Parkinson's disease Father    Hearing loss Brother    Hypertension Brother    Stroke Maternal Grandmother    Lung cancer Maternal Grandfather        lung/fhx   Stroke Paternal Grandmother    Arthritis Other        fhx   Hyperlipidemia Other        fhx   Colon polyps Neg Hx    Colon cancer Neg Hx    Esophageal cancer Neg Hx    Rectal cancer Neg Hx    Stomach cancer Neg Hx    Social History   Social History Narrative   One child, married.    BA- Runner, broadcasting/film/video.    Former smoker.    Uses seat belt, bike helmet, smoke alarm in the home   Uses herbal remedies.    Feels safe in her relationships.     Allergies as of 07/10/2024       Reactions   Ibuprofen Swelling   Penicillins Rash        Medication List        Accurate as of July 10, 2024  8:01 AM. If you have any questions, ask your nurse or doctor.          atorvastatin  20 MG tablet Commonly known as: LIPITOR Take 1 tablet (20 mg total) by mouth daily.   losartan  50 MG tablet Commonly known as: Cozaar  Take 1 tablet (50 mg total) by mouth daily.        All past medical history, surgical history, allergies, family history, immunizations andmedications were updated in the EMR today and reviewed under the history and medication portions of their EMR.     No results found for this or any previous visit (from the past 2160 hours).   ROS 14 pt review of systems  performed and negative (unless mentioned in an HPI)  Objective: BP 130/80   Pulse 79   Temp 98.2 F (36.8 C)   Ht 5' 4 (1.626 m)   Wt 192 lb 9.6 oz (87.4 kg)   SpO2 97%   BMI 33.06 kg/m  Physical  Exam Vitals and nursing note reviewed.  Constitutional:      General: She is not in acute distress.    Appearance: Normal appearance. She is not ill-appearing, toxic-appearing or diaphoretic.  HENT:     Head: Normocephalic and atraumatic.     Right Ear: Tympanic membrane, ear canal and external ear normal. There is no impacted cerumen.     Left Ear: Tympanic membrane, ear canal and external ear normal. There is no impacted cerumen.     Nose: No congestion or rhinorrhea.     Mouth/Throat:     Mouth: Mucous membranes are moist.     Pharynx: Oropharynx is clear. No oropharyngeal exudate or posterior oropharyngeal erythema.  Eyes:     General: No scleral icterus.       Right eye: No discharge.        Left eye: No discharge.     Extraocular Movements: Extraocular movements intact.     Conjunctiva/sclera: Conjunctivae normal.     Pupils: Pupils are equal, round, and reactive to light.  Cardiovascular:     Rate and Rhythm: Normal rate and regular rhythm.     Pulses: Normal pulses.     Heart sounds: Normal heart sounds. No murmur heard.    No friction rub. No gallop.  Pulmonary:     Effort: Pulmonary effort is normal. No respiratory distress.     Breath sounds: Normal breath sounds. No stridor. No wheezing, rhonchi or rales.  Chest:     Chest wall: No tenderness.  Abdominal:     General: Abdomen is flat. Bowel sounds are normal. There is no distension.     Palpations: Abdomen is soft. There is no mass.     Tenderness: There is no abdominal tenderness. There is no right CVA tenderness, left CVA tenderness, guarding or rebound.     Hernia: No hernia is present.  Musculoskeletal:        General: No swelling, tenderness or deformity. Normal range of motion.     Cervical back: Normal  range of motion and neck supple. No rigidity or tenderness.     Right lower leg: No edema.     Left lower leg: No edema.  Lymphadenopathy:     Cervical: No cervical adenopathy.  Skin:    General: Skin is warm and dry.     Coloration: Skin is not jaundiced or pale.     Findings: No bruising, erythema, lesion or rash.  Neurological:     General: No focal deficit present.     Mental Status: She is alert and oriented to person, place, and time. Mental status is at baseline.     Cranial Nerves: No cranial nerve deficit.     Sensory: No sensory deficit.     Motor: No weakness.     Coordination: Coordination normal.     Gait: Gait normal.     Deep Tendon Reflexes: Reflexes normal.  Psychiatric:        Mood and Affect: Mood normal.        Behavior: Behavior normal.        Thought Content: Thought content normal.        Judgment: Judgment normal.      No results found.  Assessment/plan: AVIANNA MOYNAHAN is a 68 y.o. female present for CPE and chronic Conditions/illness Management Essential hypertension/HLD/E66.01 stable Continue losartan  to 50 mg QD.  Continue lipitor 20 mg qd.  - low sodium  - routine  exercise.  Labs collected today  OSA-  Stable - testing completed in 2012 - CPAP not required.  Consider retesting if symptoms arise.  Tubular adenoma of colon UTD colonoscopy Diabetes mellitus screening - Hemoglobin A1c  Routine general medical examination at a health care facility (Primary) - CBC - Comprehensive metabolic panel with GFR - TSH Colonoscopy: completed 2022,  - one tubular adenoma other hyperplastic. Dr C- 7 yr follow up Mammogram: completed: 09/10/2023 with GYN ([facility]) Immunizations: tdap 04/2018, Influenza (encouraged yearly), shingrix  series completed. covid series completed.  Prevnar completed 2024 Infectious disease screening: HIV and Hep C completed DEXA: 07/05/2022- normal. [facility] Patient was encouraged to exercise greater than 150 minutes a  week. Patient was encouraged to choose a diet filled with fresh fruits and vegetables, and lean meats. AVS provided to patient today for education/recommendation on gender specific health and safety maintenance.   Return in about 24 weeks (around 12/25/2024) for Routine chronic condition follow-up.  Orders Placed This Encounter  Procedures   CBC   Hemoglobin A1c   Comprehensive metabolic panel with GFR   Lipid panel   TSH    Meds ordered this encounter  Medications   atorvastatin  (LIPITOR) 20 MG tablet    Sig: Take 1 tablet (20 mg total) by mouth daily.    Dispense:  90 tablet    Refill:  3   losartan  (COZAAR ) 50 MG tablet    Sig: Take 1 tablet (50 mg total) by mouth daily.    Dispense:  90 tablet    Refill:  1   Referral Orders  No referral(s) requested today     Electronically signed by: Charlies Bellini, DO [facility] [place]

## 2024-07-15 ENCOUNTER — Ambulatory Visit: Payer: Self-pay | Admitting: Family Medicine

## 2024-09-23 ENCOUNTER — Ambulatory Visit (INDEPENDENT_AMBULATORY_CARE_PROVIDER_SITE_OTHER): Admitting: *Deleted

## 2024-09-23 VITALS — Ht 64.0 in | Wt 192.0 lb

## 2024-09-23 DIAGNOSIS — Z0000 Encounter for general adult medical examination without abnormal findings: Secondary | ICD-10-CM | POA: Diagnosis not present

## 2024-09-23 NOTE — Patient Instructions (Signed)
 Ms. Aimee Swanson , Thank you for taking time to come for your Medicare Wellness Visit. I appreciate your ongoing commitment to your health goals. Please review the following plan we discussed and let me know if I can assist you in the future.   Screening recommendations/referrals: Colonoscopy:  Mammogram:  Bone Density:  Recommended yearly ophthalmology/optometry visit for glaucoma screening and checkup Recommended yearly dental visit for hygiene and checkup  Vaccinations: Influenza vaccine:  Pneumococcal vaccine:  Tdap vaccine:  Shingles vaccine:        Preventive Care 65 Years and Older, Female Preventive care refers to lifestyle choices and visits with your health care provider that can promote health and wellness. What does preventive care include? A yearly physical exam. This is also called an annual well check. Dental exams once or twice a year. Routine eye exams. Ask your health care provider how often you should have your eyes checked. Personal lifestyle choices, including: Daily care of your teeth and gums. Regular physical activity. Eating a healthy diet. Avoiding tobacco and drug use. Limiting alcohol use. Practicing safe sex. Taking low-dose aspirin every day. Taking vitamin and mineral supplements as recommended by your health care provider. What happens during an annual well check? The services and screenings done by your health care provider during your annual well check will depend on your age, overall health, lifestyle risk factors, and family history of disease. Counseling  Your health care provider may ask you questions about your: Alcohol use. Tobacco use. Drug use. Emotional well-being. Home and relationship well-being. Sexual activity. Eating habits. History of falls. Memory and ability to understand (cognition). Work and work astronomer. Reproductive health. Screening  You may have the following tests or measurements: Height, weight, and  BMI. Blood pressure. Lipid and cholesterol levels. These may be checked every 5 years, or more frequently if you are over 87 years old. Skin check. Lung cancer screening. You may have this screening every year starting at age 51 if you have a 30-pack-year history of smoking and currently smoke or have quit within the past 15 years. Fecal occult blood test (FOBT) of the stool. You may have this test every year starting at age 74. Flexible sigmoidoscopy or colonoscopy. You may have a sigmoidoscopy every 5 years or a colonoscopy every 10 years starting at age 62. Hepatitis C blood test. Hepatitis B blood test. Sexually transmitted disease (STD) testing. Diabetes screening. This is done by checking your blood sugar (glucose) after you have not eaten for a while (fasting). You may have this done every 1-3 years. Bone density scan. This is done to screen for osteoporosis. You may have this done starting at age 34. Mammogram. This may be done every 1-2 years. Talk to your health care provider about how often you should have regular mammograms. Talk with your health care provider about your test results, treatment options, and if necessary, the need for more tests. Vaccines  Your health care provider may recommend certain vaccines, such as: Influenza vaccine. This is recommended every year. Tetanus, diphtheria, and acellular pertussis (Tdap, Td) vaccine. You may need a Td booster every 10 years. Zoster vaccine. You may need this after age 63. Pneumococcal 13-valent conjugate (PCV13) vaccine. One dose is recommended after age 47. Pneumococcal polysaccharide (PPSV23) vaccine. One dose is recommended after age 15. Talk to your health care provider about which screenings and vaccines you need and how often you need them. This information is not intended to replace advice given to you by your health  care provider. Make sure you discuss any questions you have with your health care provider. Document  Released: 11/25/2015 Document Revised: 07/18/2016 Document Reviewed: 08/30/2015 Elsevier Interactive Patient Education  2017 [employer].  Fall Prevention in the Home Falls can cause injuries. They can happen to people of all ages. There are many things you can do to make your home safe and to help prevent falls. What can I do on the outside of my home? Regularly fix the edges of walkways and driveways and fix any cracks. Remove anything that might make you trip as you walk through a door, such as a raised step or threshold. Trim any bushes or trees on the path to your home. Use bright outdoor lighting. Clear any walking paths of anything that might make someone trip, such as rocks or tools. Regularly check to see if handrails are loose or broken. Make sure that both sides of any steps have handrails. Any raised decks and porches should have guardrails on the edges. Have any leaves, snow, or ice cleared regularly. Use sand or salt on walking paths during winter. Clean up any spills in your garage right away. This includes oil or grease spills. What can I do in the bathroom? Use night lights. Install grab bars by the toilet and in the tub and shower. Do not use towel bars as grab bars. Use non-skid mats or decals in the tub or shower. If you need to sit down in the shower, use a plastic, non-slip stool. Keep the floor dry. Clean up any water that spills on the floor as soon as it happens. Remove soap buildup in the tub or shower regularly. Attach bath mats securely with double-sided non-slip rug tape. Do not have throw rugs and other things on the floor that can make you trip. What can I do in the bedroom? Use night lights. Make sure that you have a light by your bed that is easy to reach. Do not use any sheets or blankets that are too big for your bed. They should not hang down onto the floor. Have a firm chair that has side arms. You can use this for support while you get dressed. Do  not have throw rugs and other things on the floor that can make you trip. What can I do in the kitchen? Clean up any spills right away. Avoid walking on wet floors. Keep items that you use a lot in easy-to-reach places. If you need to reach something above you, use a strong step stool that has a grab bar. Keep electrical cords out of the way. Do not use floor polish or wax that makes floors slippery. If you must use wax, use non-skid floor wax. Do not have throw rugs and other things on the floor that can make you trip. What can I do with my stairs? Do not leave any items on the stairs. Make sure that there are handrails on both sides of the stairs and use them. Fix handrails that are broken or loose. Make sure that handrails are as long as the stairways. Check any carpeting to make sure that it is firmly attached to the stairs. Fix any carpet that is loose or worn. Avoid having throw rugs at the top or bottom of the stairs. If you do have throw rugs, attach them to the floor with carpet tape. Make sure that you have a light switch at the top of the stairs and the bottom of the stairs. If you do not  have them, ask someone to add them for you. What else can I do to help prevent falls? Wear shoes that: Do not have high heels. Have rubber bottoms. Are comfortable and fit you well. Are closed at the toe. Do not wear sandals. If you use a stepladder: Make sure that it is fully opened. Do not climb a closed stepladder. Make sure that both sides of the stepladder are locked into place. Ask someone to hold it for you, if possible. Clearly mark and make sure that you can see: Any grab bars or handrails. First and last steps. Where the edge of each step is. Use tools that help you move around (mobility aids) if they are needed. These include: Canes. Walkers. Scooters. Crutches. Turn on the lights when you go into a dark area. Replace any light bulbs as soon as they burn out. Set up your  furniture so you have a clear path. Avoid moving your furniture around. If any of your floors are uneven, fix them. If there are any pets around you, be aware of where they are. Review your medicines with your doctor. Some medicines can make you feel dizzy. This can increase your chance of falling. Ask your doctor what other things that you can do to help prevent falls. This information is not intended to replace advice given to you by your health care provider. Make sure you discuss any questions you have with your health care provider. Document Released: 08/25/2009 Document Revised: 04/05/2016 Document Reviewed: 12/03/2014 Elsevier Interactive Patient Education  2017 [employer].

## 2024-09-23 NOTE — Progress Notes (Signed)
 Chief Complaint  Patient presents with   Medicare Wellness     Subjective:   Aimee Swanson is a 68 y.o. female who presents for a Medicare Annual Wellness Visit.  Allergies (verified) Ibuprofen and Penicillins   History: Past Medical History:  Diagnosis Date   Atypical squamous cells of undetermined significance (ASCUS) on Papanicolaou smear of cervix 03/10/2021   08/28/2001   Cervical strain 06/07/2020   Chicken pox    COVID-19 05/2021   Gynecological examination    sees Dr. 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 Corrie    Shingles    at age 52    Sleep apnea    Past Surgical History:  Procedure Laterality Date   CESAREAN SECTION  1999   COLONOSCOPY  06-28-11   per Dr. Obie, clear, repeat in 10 yrs    LEFT OOPHORECTOMY  1996   MYOMECTOMY     ORIF TIBIA & FIBULA FRACTURES  1992   left   UTERINE FIBROID SURGERY  2009   Family History  Problem Relation Age of Onset   Asthma Mother    Skin cancer Mother    Hearing loss Mother    Hearing loss Father    Parkinson's disease Father    Hearing loss Brother    Hypertension Brother    Stroke Maternal Grandmother    Lung cancer Maternal Grandfather        lung/fhx   Stroke Paternal Grandmother    Arthritis Other        fhx   Hyperlipidemia Other        fhx   Colon polyps Neg Hx    Colon cancer Neg Hx    Esophageal cancer Neg Hx    Rectal cancer Neg Hx    Stomach cancer Neg Hx    Social History   Occupational History   Occupation: Magazine Features Editor: [employer]  Tobacco Use   Smoking status: Former    Current packs/day: 0.25    Average packs/day: 0.3 packs/day for 2.0 years (0.5 ttl pk-yrs)    Types: Cigarettes   Smokeless tobacco: Never   Vaping Use   Vaping status: Never Used  Substance and Sexual Activity   Alcohol use: Yes    Alcohol/week: 2.0 standard drinks of alcohol    Types: 2 Standard drinks or equivalent per week   Drug use: No   Sexual activity: Not Currently   Tobacco Counseling Counseling given: Not Answered  SDOH Screenings   Food Insecurity: No Food Insecurity (09/23/2024)  Housing: Unknown (09/23/2024)  Transportation Needs: No Transportation Needs (09/23/2024)  Utilities: Not At Risk (09/23/2024)  Depression (PHQ2-9): Low Risk  (09/23/2024)  Physical Activity: Inactive (09/23/2024)  Social Connections: Socially Integrated (09/23/2024)  Stress: No Stress Concern Present (09/23/2024)  Tobacco Use: Medium Risk (09/23/2024)  Health Literacy: Adequate Health Literacy (09/23/2024)   Depression Screen    09/23/2024    8:48 AM 09/23/2024    8:15 AM 07/10/2024    8:00 AM 12/24/2023    8:07 AM 07/09/2023    8:09 AM 02/28/2023    8:41 AM 07/06/2022    3:51 PM  PHQ 2/9 Scores  PHQ - 2 Score 0 0 0  0 0 0 0  PHQ- 9 Score 0   0  0        Data saved with a previous flowsheet row definition     Goals Addressed             This Visit's Progress    Increase physical activity       Loose weight       Visit info / Clinical Intake: Medicare Wellness Visit Type:: Initial Annual Wellness Visit Persons participating in visit:: patient Medicare Wellness Visit Mode:: Telephone If telephone:: video declined Because this visit was a virtual/telehealth visit:: unable to obtan vitals due to lack of equipment If Telephone or Video please confirm:: I connected with the patient using audio enabled telemedicine application and verified that I am speaking with the correct person using two identifiers; I discussed the limitations of evaluation and management by telemedicine; The patient expressed understanding and agreed to proceed Patient Location:: home Provider Location:: home Information given by::  patient Interpreter Needed?: No Pre-visit prep was completed: no AWV questionnaire completed by patient prior to visit?: no Living arrangements:: lives with spouse/significant other Patient's Overall Health Status Rating: good Typical amount of pain: none Does pain affect daily life?: no Are you currently prescribed opioids?: no  Dietary Habits and Nutritional Risks How many meals a day?: 2 Eats fruit and vegetables daily?: yes Most meals are obtained by: preparing own meals Diabetic:: no  Functional Status Activities of Daily Living (to include ambulation/medication): Independent Ambulation: Independent Medication Administration: Independent Home Management: Independent Manage your own finances?: yes Primary transportation is: driving Concerns about vision?: no *vision screening is required for WTM* Concerns about hearing?: no  Fall Screening Falls in the past year?: 0 Number of falls in past year: 0 Was there an injury with Fall?: 0 Fall Risk Category Calculator: 0 Patient Fall Risk Level: Low Fall Risk  Fall Risk Patient at Risk for Falls Due to: No Fall Risks Fall risk Follow up: Falls evaluation completed; Education provided; Falls prevention discussed  Home and Transportation Safety: All rugs have non-skid backing?: yes All stairs or steps have railings?: N/A, no stairs Grab bars in the bathtub or shower?: (!) no Have non-skid surface in bathtub or shower?: (!) no Good home lighting?: yes Regular seat belt use?: yes Hospital stays in the last year:: no  Cognitive Assessment Difficulty concentrating, remembering, or making decisions? : no Will 6CIT or Mini Cog be Completed: yes What year is it?: 0 points What month is it?: 0 points Give patient an address phrase to remember (5 components): Its very sunny outside today in November About what time is it?: 0 points Count backwards from 20 to 1: 0 points Say the months of the year in reverse: 0 points Repeat  the address phrase from earlier: 0 points 6 CIT Score: 0 points  Advance Directives (For Healthcare) Does Patient Have a Medical Advance Directive?: Yes Type of Advance Directive: Living will Copy of Living Will in Chart?: No - copy requested  Reviewed/Updated  Reviewed/Updated: Reviewed All (Medical, Surgical, Family, Medications, Allergies, Care Teams, Patient Goals); Surgical History; Family History; Medications; Allergies; Care Teams; Patient Goals; Medical History        Objective:    Today's Vitals   09/23/24 0811  Weight: 192 lb (87.1 kg)  Height: 5' 4 (1.626 m)   Body mass index is 32.96 kg/m.  Current Medications (verified) Outpatient Encounter Medications as of 09/23/2024  Medication Sig   atorvastatin  (LIPITOR) 20 MG tablet  Take 1 tablet (20 mg total) by mouth daily.   losartan  (COZAAR ) 50 MG tablet Take 1 tablet (50 mg total) by mouth daily.   No facility-administered encounter medications on file as of 09/23/2024.   Hearing/Vision screen Hearing Screening - Comments:: No trouble hearing Vision Screening - Comments:: Not up date Looking for a new one was [facility] Immunizations and Health Maintenance Health Maintenance  Topic Date Due   Mammogram  09/09/2024   Influenza Vaccine  02/09/2025 (Originally 06/12/2024)   Medicare Annual Wellness (AWV)  09/23/2025   DTaP/Tdap/Td (3 - Td or Tdap) 05/01/2028   Colonoscopy  06/28/2028   DEXA SCAN  07/05/2032   Pneumococcal Vaccine: 50+ Years  Completed   Hepatitis C Screening  Completed   Zoster Vaccines- Shingrix   Completed   Meningococcal B Vaccine  Aged Out   COVID-19 Vaccine  Discontinued        Assessment/Plan:  This is a routine wellness examination for Lyly.  Patient Care Team: Catherine Charlies LABOR, DO as PCP - General (Family Medicine) Gorge Ade, MD as Consulting Physician (Obstetrics and Gynecology) Roz Anes, MD as Consulting Physician (Ophthalmology) San Sandor GAILS, DO as Consulting  Physician (Gastroenterology) Melodi Lerner, MD as Consulting Physician (Orthopedic Surgery)  I have personally reviewed and noted the following in the patient's chart:   Medical and social history Use of alcohol, tobacco or illicit drugs  Current medications and supplements including opioid prescriptions. Functional ability and status Nutritional status Physical activity Advanced directives List of other physicians Hospitalizations, surgeries, and ER visits in previous 12 months Vitals Screenings to include cognitive, depression, and falls Referrals and appointments  No orders of the defined types were placed in this encounter.  In addition, I have reviewed and discussed with patient certain preventive protocols, quality metrics, and best practice recommendations. A written personalized care plan for preventive services as well as general preventive health recommendations were provided to patient.   Mliss Graff, LPN   [id number]   Return in 1 year (on 09/23/2025).  After Visit Summary: (MyChart) Due to this being a telephonic visit, the after visit summary with patients personalized plan was offered to patient via MyChart   Nurse Notes:

## 2024-09-29 DIAGNOSIS — Z1231 Encounter for screening mammogram for malignant neoplasm of breast: Secondary | ICD-10-CM | POA: Diagnosis not present

## 2024-10-21 DIAGNOSIS — N6012 Diffuse cystic mastopathy of left breast: Secondary | ICD-10-CM | POA: Diagnosis not present

## 2024-10-21 DIAGNOSIS — R92332 Mammographic heterogeneous density, left breast: Secondary | ICD-10-CM | POA: Diagnosis not present

## 2024-10-21 DIAGNOSIS — R928 Other abnormal and inconclusive findings on diagnostic imaging of breast: Secondary | ICD-10-CM | POA: Diagnosis not present

## 2024-12-21 ENCOUNTER — Ambulatory Visit: Admitting: Family Medicine
